# Patient Record
Sex: Female | Born: 1965 | Race: White | Hispanic: No | Marital: Married | Smoking: Never smoker
Health system: Southern US, Community
[De-identification: ages and names within clinical notes are randomized; demographics above are authoritative.]

## PROBLEM LIST (undated history)

## (undated) DIAGNOSIS — F419 Anxiety disorder, unspecified: Secondary | ICD-10-CM

## (undated) DIAGNOSIS — R0789 Other chest pain: Secondary | ICD-10-CM

## (undated) DIAGNOSIS — G479 Sleep disorder, unspecified: Secondary | ICD-10-CM

## (undated) HISTORY — DX: Sleep disorder, unspecified: G47.9

## (undated) HISTORY — PX: ABLATION: SHX5711

## (undated) HISTORY — PX: TUBAL LIGATION: SHX77

---
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
## 1997-05-17 ENCOUNTER — Other Ambulatory Visit: Admission: RE | Admit: 1997-05-17 | Discharge: 1997-05-17 | Payer: Self-pay | Admitting: *Deleted

## 1997-07-26 ENCOUNTER — Ambulatory Visit (HOSPITAL_COMMUNITY): Admission: RE | Admit: 1997-07-26 | Discharge: 1997-07-26 | Payer: Self-pay | Admitting: *Deleted

## 1998-05-30 ENCOUNTER — Other Ambulatory Visit: Admission: RE | Admit: 1998-05-30 | Discharge: 1998-05-30 | Payer: Self-pay | Admitting: *Deleted

## 1999-08-14 ENCOUNTER — Other Ambulatory Visit: Admission: RE | Admit: 1999-08-14 | Discharge: 1999-08-14 | Payer: Self-pay | Admitting: *Deleted

## 1999-10-16 ENCOUNTER — Encounter (INDEPENDENT_AMBULATORY_CARE_PROVIDER_SITE_OTHER): Payer: Self-pay

## 1999-10-16 ENCOUNTER — Other Ambulatory Visit: Admission: RE | Admit: 1999-10-16 | Discharge: 1999-10-16 | Payer: Self-pay | Admitting: *Deleted

## 2000-08-21 ENCOUNTER — Other Ambulatory Visit: Admission: RE | Admit: 2000-08-21 | Discharge: 2000-08-21 | Payer: Self-pay | Admitting: *Deleted

## 2000-12-04 ENCOUNTER — Ambulatory Visit (HOSPITAL_COMMUNITY): Admission: RE | Admit: 2000-12-04 | Discharge: 2000-12-04 | Payer: Self-pay | Admitting: *Deleted

## 2000-12-04 ENCOUNTER — Encounter (INDEPENDENT_AMBULATORY_CARE_PROVIDER_SITE_OTHER): Payer: Self-pay | Admitting: Specialist

## 2001-10-06 ENCOUNTER — Other Ambulatory Visit: Admission: RE | Admit: 2001-10-06 | Discharge: 2001-10-06 | Payer: Self-pay | Admitting: *Deleted

## 2002-10-26 ENCOUNTER — Other Ambulatory Visit: Admission: RE | Admit: 2002-10-26 | Discharge: 2002-10-26 | Payer: Self-pay | Admitting: *Deleted

## 2003-02-24 ENCOUNTER — Ambulatory Visit (HOSPITAL_COMMUNITY): Admission: RE | Admit: 2003-02-24 | Discharge: 2003-02-24 | Payer: Self-pay | Admitting: *Deleted

## 2003-02-24 ENCOUNTER — Encounter (INDEPENDENT_AMBULATORY_CARE_PROVIDER_SITE_OTHER): Payer: Self-pay | Admitting: Specialist

## 2003-11-10 ENCOUNTER — Other Ambulatory Visit: Admission: RE | Admit: 2003-11-10 | Discharge: 2003-11-10 | Payer: Self-pay | Admitting: Obstetrics and Gynecology

## 2004-10-18 ENCOUNTER — Other Ambulatory Visit: Admission: RE | Admit: 2004-10-18 | Discharge: 2004-10-18 | Payer: Self-pay | Admitting: Obstetrics and Gynecology

## 2006-06-22 ENCOUNTER — Ambulatory Visit: Payer: Self-pay

## 2010-06-21 NOTE — H&P (Signed)
NAME:  Judith Baker, Judith Baker                      ACCOUNT NO.:  [id number]   MEDICAL RECORD NO.:  [id number]                   PATIENT TYPE:  AMB   LOCATION:  SDC                                  FACILITY:  [facility]   PHYSICIAN:  Tracie Harrier, M.D.              DATE OF BIRTH:  1965-10-26   DATE OF ADMISSION:  02/23/2003  DATE OF DISCHARGE:                                HISTORY & PHYSICAL   HISTORY OF PRESENT ILLNESS:  Ms. Eke is a 45 year old female gravida 2,  para 2, status post bilateral tubal ligation.  She is admitted for repeat  hysteroscopy and possible endometrial ablation.  She underwent ablation in  2002 which was quite successful.  Since then she has had repeat bleeding  twice weekly for the past 2 months.  Different treatment options were  reviewed with the patient.  Due to her busy schedule she has requested  repeat hysteroscopy and endometrial ablation.  The risk, benefits and goals  of therapy were reviewed with her.  Also the treatment limitations reviewed  as well.   The patient did have a normal Pap smear in September, 2004.  She is  otherwise very healthy.   PAST MEDICAL HISTORY:  1. History of abnormal uterine bleeding.  2. History of anemia.   SURGICAL HISTORY:  1. Hysteroscopy and endometrial ablation in 2002.  2. Bilateral tubal ligation by laparoscopy.   OBSTETRIC HISTORY:  Normal spontaneous vaginal delivery x2 at term.   CURRENT MEDICATION:  Prozac.   ALLERGIES:  None known.   PHYSICAL EXAMINATION:  VITAL SIGNS:  Stable, temperature 97.2, blood  pressure 110/78.  GENERAL:  She is a well-developed, well-nourished female in no acute  distress.  HEENT:  Within normal limits.  NECK:  Supple without adenopathy or thyromegaly.  HEART:  Regular rate and rhythm without murmur, rub, or gallop.  LUNGS:  Clear to auscultation.  BREASTS:  Exam done recently in the office was normal.  However, this is  deferred upon admission.  ABDOMEN:  Soft and benign  without masses, tenderness, hernia, or  organomegaly.  EXTREMITIES:  Neurologically grossly normal.  PELVIC EXAM:  Normal external female genitalia, vagina, cervix clear.  The  uterus is upper limits of normal.  Adnexae clear.   ADMISSION DIAGNOSIS:  Abnormal uterine bleeding.   PLAN:  1. Hysteroscopy.  2. Endometrial ablation with Novasure technology.   The risk and benefits as well as need and alternative therapies reviewed  with patient.  Questions were answered.                                               Tracie Harrier, M.D.    REG/MEDQ  D:  02/23/2003  T:  02/24/2003  Job:  [id number]

## 2010-06-21 NOTE — H&P (Signed)
[facility]  Patient:    Judith Baker, Judith Baker Visit Number: [id number] MRN: [id number]          Service Type: DSU Location: DAY Attending Physician:  Donne Hazel Dictated by:   Willey Blade, M.D. Admit Date:  12/04/2000                           History and Physical  HISTORY OF PRESENT ILLNESS:  The patient is a 45 year old female, gravida 2, para 2, status post bilateral tubal ligation.  The patient is admitted at this time to undergo hysteroscopy with resection of intrauterine process as well as endometrial ablation.  The patient has been plagued with abnormal uterine bleeding over the past causing her anemia.  She underwent ultrasound recently, which showed thickened endometrium at the fundal region consistent with uterine polyps.  After informed consent and discussion of different treatment options, she has decided to proceed with D&C, hysteroscopy, and endometrial ablation.  She is currently taking iron for her anemia, and this has improved somewhat over the past two months.  PAST MEDICAL HISTORY: 1. History of abnormal uterine bleeding. 2. History of anemia.  OBSTETRICAL HISTORY:  Normal spontaneous vaginal delivery x 2 at term.  PAST SURGICAL HISTORY:  Status post laparoscopic bilateral tubal ligation.  MEDICATIONS:  Iron.  ALLERGIES:  None known.  PHYSICAL EXAMINATION:  VITAL SIGNS:  Stable.  Temperature 97.2, pulse 74, respirations 20, blood pressure 110/78.  GENERAL:  She is a well-developed, well-nourished female in no acute distress.  HEENT:  Within normal limits.  NECK:  Supple without adenopathy or thyromegaly.  CHEST:  Lungs clear to auscultation.  BREASTS:  Breast exam done recently in the office was normal but is deferred upon admission.  CARDIAC:  Regular rate and rhythm without murmur, gallop, or rub.  ABDOMEN:  Soft and benign without masses, tenderness, hernia, or organomegaly.  EXTREMITIES:  Grossly  normal.  NEUROLOGIC:  Grossly normal.  PELVIC:  Normal external female genitalia, vagina and cervix clear.  The uterus is small, nontender, and mobile.  The adnexa are clear of mass.  ADMITTING DIAGNOSES: 1. Abnormal uterine bleeding. 2. Intrauterine process, consistent with endometrial polyps. 3. Anemia.  PLAN: 1. Hysteroscopy with resection of endometrial process. 2. Endometrial ablation.  DISCUSSION:  The risks, benefits, and alternative therapies discussed with patient.  She was allowed to ask questions and wished to proceed. Dictated by:   Willey Blade, M.D. Attending Physician:  Donne Hazel DD:  12/04/00 TD:  12/05/00 Job: [id number]

## 2010-06-21 NOTE — Op Note (Signed)
Judith Baker, Judith Baker                      ACCOUNT NO.:  [id number]   MEDICAL RECORD NO.:  [id number]                   PATIENT TYPE:  AMB   LOCATION:  SDC                                  FACILITY:  [facility]   PHYSICIAN:  Tracie Harrier, M.D.              DATE OF BIRTH:  10-22-1965   DATE OF PROCEDURE:  02/27/2003  DATE OF DISCHARGE:  02/24/2003                                 OPERATIVE REPORT   PREOPERATIVE DIAGNOSIS:  Abnormal uterine bleeding.   POSTOPERATIVE DIAGNOSES:  1. Abnormal uterine bleeding.  2. Nevus removal.   PROCEDURE:  1. Hysteroscopy.  2. Ablation of endometriosis with a roller ball.  3. Removal of nevus.   SURGEON:  Tracie Harrier, M.D.   ANESTHESIA:  General.   ESTIMATED BLOOD LOSS:  20 mL.   COMPLICATIONS:  None.   FINDINGS:  At the time of hysteroscopy, a well-treated scarred endometrial  cavity was identified.  There was an area of pink viable tissue in the  fundus to the left.  The right portion of the endometrial cavity was  scarred.  This area of endometrial tissue was ablated.   A large 2 cm left buttock nevus was removed with sharp dissection.   DESCRIPTION OF PROCEDURE:  The patient was taken to the operating room where  a general anesthetic was administered.  The patient was placed on the  operating room table in the dorsal lithotomy position.  The perineum and  vagina were prepped and draped in the usual sterile fashion with Betadine  and sterile drapes.  The bladder was emptied with a red rubber catheter.  A  speculum was placed and the lip of the cervix was grasped with a single-  tooth tenaculum.  The cervix was then dilated until a #23 Pratt dilator  could easily enter the endocervical os.  The hysteroscope was then inserted.  Lactated Ringers was used as a visualizing medium.  The above noted findings  were noted.  The hysteroscope was then removed and the cervix was dilated  until a #31 Pratt dilator fit easily into the  intracervical os.  The viable  tissue in the uterine fundus was then cauterized with the roller ball device  and the resectoscope.  The visualizing medium was switched to sorbitol.  This was ablated although it was minimal tissue.  Good hemostasis was noted.  All of the vaginal instruments were then removed.  Deficit was 150 mL.  Bleeding was minimal.   A nevus on the left buttock was removed.  This was grasped with the pickups  and sharply removed with the scissors.  Bleeding was controlled with  Monsel's solution.  The patient was notified of this after this procedure.  She was comfortable with removal of this.  The patient was then awakened and  taken to the recovery room in good condition.  There were no perioperative  complications.  Tracie Harrier, M.D.    REG/MEDQ  D:  02/27/2003  T:  02/27/2003  Job:  [id number]

## 2010-06-21 NOTE — Op Note (Signed)
[facility]  Patient:    Judith Baker, Judith Baker Visit Number: [id number] MRN: [id number]          Service Type: DSU Location: DAY Attending Physician:  Donne Hazel Dictated by:   Willey Blade, M.D. Proc. Date: 12/04/00 Admit Date:  12/04/2000 Discharge Date: 12/04/2000                             Operative Report  PREOPERATIVE DIAGNOSES: 1. Abnormal uterine bleeding. 2. Endometrial process consistent with endometrial polyps. 3. Anemia.  POSTOPERATIVE DIAGNOSES: 1. Abnormal uterine bleeding. 2. Endometrial process consistent with endometrial polyps. 3. Anemia.  PROCEDURES: 1. Hysteroscopy. 2. Hysteroscopic resection of endometrial polyps. 3. Endometrial ablation electrocautery.  SURGEON:  Willey Blade, M.D.  ANESTHESIA:  General.  ESTIMATED BLOOD LOSS:  20 cc.  COMPLICATIONS:  None.  FINDINGS:  At the time of hysteroscopy, abundant endometrial tissue was encountered.  There were multiple outpouching of this endometrial tissue consistent with polyps.  These were about three in number.  The endometrial ablation went well with no complications.  DESCRIPTION OF PROCEDURE:  The patient was taken to the operating room where a general anesthetic was administered.  The patient was placed on the operating table in the dorsolithotomy position.  The perineum and vagina were prepped and draped in the usual sterile fashion with Betadine and sterile drapes.  A red rubber catheter was used to empty the bladder.  A weighted speculum was placed in the posterior fornix of the vagina and the anterior lip of the cervix was grasped with a single-tooth tenaculum.  The cervix was then serially dilated until a #31 Pratt dilator could easily enter the anterior cervical os.  A hysteroscope was then inserted with the above-noted findings. Sorbitol was used as a visualizing median.  There was no recorded deficit of this fluid during the hysteroscopic  procedure.  The small endometrial polyps at the fundal area were shaved off using the hysteroscopic loop.  Cautery was performed along the say to ensure good hemostasis.  Endometrial ablation was then carried out using the roller bar.  The endometrium was systemically and thoroughly coagulated.  No complications occurred.  As mentioned, no fluid deficit was recorded.  The uterus was ablated as completely as possible with the roller ball device.  After this, one final visualization was carried out with no surgical complications noted.  All vaginal instruments were then removed and the patient awaken.  She was then taken to the recovery room in good condition.  There were no perioperative complications. Dictated by:   Willey Blade, M.D. Attending Physician:  Donne Hazel DD:  12/04/00 TD:  12/06/00 Job: [id number]

## 2011-10-21 ENCOUNTER — Emergency Department (HOSPITAL_COMMUNITY)
Admission: EM | Admit: 2011-10-21 | Discharge: 2011-10-21 | Discharge status: Absent without leave | Payer: 59 | Source: Home / Self Care

## 2013-11-06 ENCOUNTER — Emergency Department
Admission: EM | Admit: 2013-11-06 | Discharge: 2013-11-06 | Disposition: A | Discharge status: Home / Self Care | Payer: 59 | Source: Home / Self Care | Attending: Family Medicine | Admitting: Family Medicine

## 2013-11-06 ENCOUNTER — Encounter: Payer: Self-pay | Admitting: Emergency Medicine

## 2013-11-06 DIAGNOSIS — N3001 Acute cystitis with hematuria: Secondary | ICD-10-CM

## 2013-11-06 LAB — POCT URINALYSIS DIP (MANUAL ENTRY)
BILIRUBIN UA: NEGATIVE
GLUCOSE UA: NEGATIVE
Ketones, POC UA: NEGATIVE
Nitrite, UA: NEGATIVE
PH UA: 5.5
Protein Ur, POC: 30
SPEC GRAV UA: 1.015
UROBILINOGEN UA: 0.2

## 2013-11-06 MED ORDER — CIPROFLOXACIN HCL 500 MG PO TABS: 500.0000 mg | ORAL_TABLET | Freq: Two times a day (BID) | ORAL | Status: DC

## 2013-11-06 NOTE — ED Notes (Signed)
Sx began 10/3 with pressure then frequency and discomfort with urination

## 2013-11-06 NOTE — Discharge Instructions (Signed)
Thank you for coming in today. If your belly pain worsens, or you have high fever, bad vomiting, blood in your stool or black tarry stool go to the Emergency Room.   Urinary Tract Infection Urinary tract infections (UTIs) can develop anywhere along your urinary tract. Your urinary tract is your body's drainage system for removing wastes and extra water. Your urinary tract includes two kidneys, two ureters, a bladder, and a urethra. Your kidneys are a pair of bean-shaped organs. Each kidney is about the size of your fist. They are located below your ribs, one on each side of your spine. CAUSES Infections are caused by microbes, which are microscopic organisms, including fungi, viruses, and bacteria. These organisms are so small that they can only be seen through a microscope. Bacteria are the microbes that most commonly cause UTIs. SYMPTOMS  Symptoms of UTIs may vary by age and gender of the patient and by the location of the infection. Symptoms in young women typically include a frequent and intense urge to urinate and a painful, burning feeling in the bladder or urethra during urination. Older women and men are more likely to be tired, shaky, and weak and have muscle aches and abdominal pain. A fever may mean the infection is in your kidneys. Other symptoms of a kidney infection include pain in your back or sides below the ribs, nausea, and vomiting. DIAGNOSIS To diagnose a UTI, your caregiver will ask you about your symptoms. Your caregiver also will ask to provide a urine sample. The urine sample will be tested for bacteria and white blood cells. White blood cells are made by your body to help fight infection. TREATMENT  Typically, UTIs can be treated with medication. Because most UTIs are caused by a bacterial infection, they usually can be treated with the use of antibiotics. The choice of antibiotic and length of treatment depend on your symptoms and the type of bacteria causing your  infection. HOME CARE INSTRUCTIONS  If you were prescribed antibiotics, take them exactly as your caregiver instructs you. Finish the medication even if you feel better after you have only taken some of the medication.  Drink enough water and fluids to keep your urine clear or pale yellow.  Avoid caffeine, tea, and carbonated beverages. They tend to irritate your bladder.  Empty your bladder often. Avoid holding urine for long periods of time.  Empty your bladder before and after sexual intercourse.  After a bowel movement, women should cleanse from front to back. Use each tissue only once. SEEK MEDICAL CARE IF:   You have back pain.  You develop a fever.  Your symptoms do not begin to resolve within 3 days. SEEK IMMEDIATE MEDICAL CARE IF:   You have severe back pain or lower abdominal pain.  You develop chills.  You have nausea or vomiting.  You have continued burning or discomfort with urination. MAKE SURE YOU:   Understand these instructions.  Will watch your condition.  Will get help right away if you are not doing well or get worse. Document Released: 10/30/2004 Document Revised: 07/22/2011 Document Reviewed: 02/28/2011 ExitCare Patient Information 2015 [employer]. This information is not intended to replace advice given to you by your health care provider. Make sure you discuss any questions you have with your health care provider.  

## 2013-11-06 NOTE — ED Provider Notes (Signed)
Judith Baker is a 48 y.o. female who presents to Urgent Care today for urinary tract infection. Patient has a one-day history of urinary frequency urgency pressure and burning with urination. Symptoms are consistent with previous UTI. Chills nausea vomiting or diarrhea. Patient took leftover ciprofloxacin yesterday which seems to help.   History reviewed. No pertinent past medical history. History  Substance Use Topics  . Smoking status: Not on file  . Smokeless tobacco: Not on file  . Alcohol Use: Not on file   ROS as above Medications: No current facility-administered medications for this encounter.   Current Outpatient Prescriptions  Medication Sig Dispense Refill  . ciprofloxacin (CIPRO) 500 MG tablet Take 1 tablet (500 mg total) by mouth every 12 (twelve) hours.  14 tablet  0    Exam:  BP 132/87  Pulse 86  Temp(Src) 98.3 F (36.8 C) (Oral)  Ht 5\' 3"  (1.6 m)  Wt 129 lb 4 oz (58.627 kg)  BMI 22.90 kg/m2  SpO2 100%  Gen: Well NAD HEENT: EOMI,  MMM Lungs: Normal work of breathing. CTABL Heart: RRR no MRG Abd: NABS, Soft. Nondistended, Nontender no CV angle tenderness to percussion Exts: Brisk capillary refill, warm and well perfused.   Results for orders placed during the hospital encounter of 11/06/13 (from the past 24 hour(s))  POCT URINALYSIS DIP (MANUAL ENTRY)     Status: None   Collection Time    11/06/13  3:15 PM      Result Value Ref Range   Color, UA yellow     Clarity, UA cloudy     Glucose, UA neg     Bilirubin, UA negative     Bilirubin, UA negative     Spec Grav, UA 1.015     Blood, UA moderate     pH, UA 5.5     Protein Ur, POC =30     Urobilinogen, UA 0.2     Nitrite, UA Negative     Leukocytes, UA moderate (2+)     No results found.  Assessment and Plan: 48 y.o. female with urinary tract infection. Plan to treat with Cipro. Culture pending.  Discussed warning signs or symptoms. Please see discharge instructions. Patient expresses  understanding.     Rodolph BongEvan S Corey, MD 11/06/13 [phone]

## 2013-11-08 ENCOUNTER — Telehealth: Payer: Self-pay | Admitting: Emergency Medicine

## 2013-11-08 MED ORDER — SULFAMETHOXAZOLE-TRIMETHOPRIM 800-160 MG PO TABS: 1.0000 | ORAL_TABLET | Freq: Two times a day (BID) | ORAL | Status: DC

## 2013-11-08 NOTE — Telephone Encounter (Signed)
Spoke to pt today. Start cipro on 10/4 does not feel like symptoms are getting better. Culture sensitivities are still pending. She is still having a lot of pelvic pressure and urinating blood today. It comes in waves of a lot of symptoms and then she will feel fine. Denies any fever, chill, n/v/d. Decided to switch to bactrim for 7 days. Stop cipro. Will continue to wait for sensitives. Ok to also take pyridium. Pt has hx of fibroids. Could be that GYN needs to look and see if fibroids causing pressure on bladder.

## 2013-11-09 LAB — URINE CULTURE

## 2013-11-12 ENCOUNTER — Telehealth: Payer: Self-pay | Admitting: Family Medicine

## 2013-11-12 NOTE — ED Notes (Signed)
Urine culture shows E.Coli ESBL sensitive to nitrofurantoin and several IV antibiotics.  Resistant to both Cipro and Bactrim.  Called and left a message advising patient to call me back today.  Rodolph BongEvan S Flora Parks, MD 11/12/13 [phone]

## 2013-11-29 ENCOUNTER — Ambulatory Visit (INDEPENDENT_AMBULATORY_CARE_PROVIDER_SITE_OTHER): Payer: 59 | Admitting: Family Medicine

## 2013-11-29 VITALS — BP 122/82 | HR 72 | Temp 98.3°F | Resp 15 | Ht 62.25 in | Wt 131.4 lb

## 2013-11-29 DIAGNOSIS — L259 Unspecified contact dermatitis, unspecified cause: Secondary | ICD-10-CM

## 2013-11-29 DIAGNOSIS — R21 Rash and other nonspecific skin eruption: Secondary | ICD-10-CM

## 2013-11-29 MED ORDER — EPINEPHRINE 0.3 MG/0.3ML IJ SOAJ: 0.3000 mg | Freq: Once | INTRAMUSCULAR | Status: DC

## 2013-11-29 MED ORDER — METHYLPREDNISOLONE ACETATE 80 MG/ML IJ SUSP
80.0000 mg | Freq: Once | INTRAMUSCULAR | Status: AC
  Administered 2013-11-29: 80 mg via INTRAMUSCULAR

## 2013-11-29 MED ORDER — METHYLPREDNISOLONE (PAK) 4 MG PO TABS: ORAL_TABLET | ORAL | Status: DC

## 2013-11-29 NOTE — Patient Instructions (Signed)
Anaphylactic Reaction An anaphylactic reaction is a sudden, severe allergic reaction that involves the whole body. It can be life threatening. A hospital stay is often required. People with asthma, eczema, or hay fever are slightly more likely to have an anaphylactic reaction. CAUSES  An anaphylactic reaction may be caused by anything to which you are allergic. After being exposed to the allergic substance, your immune system becomes sensitized to it. When you are exposed to that allergic substance again, an allergic reaction can occur. Common causes of an anaphylactic reaction include:  Medicines.  Foods, especially peanuts, wheat, shellfish, milk, and eggs.  Insect bites or stings.  Blood products.  Chemicals, such as dyes, latex, and contrast material used for imaging tests. SYMPTOMS  When an allergic reaction occurs, the body releases histamine and other substances. These substances cause symptoms such as tightening of the airway. Symptoms often develop within seconds or minutes of exposure. Symptoms may include:  Skin rash or hives.  Itching.  Chest tightness.  Swelling of the eyes, tongue, or lips.  Trouble breathing or swallowing.  Lightheadedness or fainting.  Anxiety or confusion.  Stomach pains, vomiting, or diarrhea.  Nasal congestion.  A fast or irregular heartbeat (palpitations). DIAGNOSIS  Diagnosis is based on your history of recent exposure to allergic substances, your symptoms, and a physical exam. Your caregiver may also perform blood or urine tests to confirm the diagnosis. TREATMENT  Epinephrine medicine is the main treatment for an anaphylactic reaction. Other medicines that may be used for treatment include antihistamines, steroids, and albuterol. In severe cases, fluids and medicine to support blood pressure may be given through an intravenous line (IV). Even if you improve after treatment, you need to be observed to make sure your condition does not get  worse. This may require a stay in the hospital. HOME CARE INSTRUCTIONS   Wear a medical alert bracelet or necklace stating your allergy.  You and your family must learn how to use an anaphylaxis kit or give an epinephrine injection to temporarily treat an emergency allergic reaction. Always carry your epinephrine injection or anaphylaxis kit with you. This can be lifesaving if you have a severe reaction.  Do not drive or perform tasks after treatment until the medicines used to treat your reaction have worn off, or until your caregiver says it is okay.  If you have hives or a rash:  Take medicines as directed by your caregiver.  You may use an over-the-counter antihistamine (diphenhydramine) as needed.  Apply cold compresses to the skin or take baths in cool water. Avoid hot baths or showers. SEEK MEDICAL CARE IF:   You develop symptoms of an allergic reaction to a new substance. Symptoms may start right away or minutes later.  You develop a rash, hives, or itching.  You develop new symptoms. SEEK IMMEDIATE MEDICAL CARE IF:   You have swelling of the mouth, difficulty breathing, or wheezing.  You have a tight feeling in your chest or throat.  You develop hives, swelling, or itching all over your body.  You develop severe vomiting or diarrhea.  You feel faint or pass out. This is an emergency. Use your epinephrine injection or anaphylaxis kit as you have been instructed. Call your local emergency services (911 in [place] Even if you improve after the injection, you need to be examined at a hospital emergency department. MAKE SURE YOU:   Understand these instructions.  Will watch your condition.  Will get help right away if you are not   doing well or get worse. Document Released: 01/20/2005 Document Revised: 01/25/2013 Document Reviewed: 04/23/2011 Exit[employer] Patient Information 2015 [employer]. This information is not intended to replace advice given to you by your health  care provider. Make sure you discuss any questions you have with your health care provider.  

## 2013-11-29 NOTE — Progress Notes (Signed)
Chief Complaint:  Chief Complaint  Patient presents with  . Rash    on face ?allergic reaction to make up    HPI: Judith Baker is a 48 y.o. female who is here for  Rash on her face which started today, she can;t think of anything different except that she  Used a new foundation base. She has some swellingbeneath her eyes and also ha sahd a rash on her chest. TThe rash on her chest has resolved, but the rash on her face is still present. She states she feels some heavy pressure when she breathes but that has been intermittent, has not used anythign for this. She ahs ahs allergic reactions in the past where she was given steroids. She has not traveled anywhere, no new meds or foods, she ahs used new sopas but same brand as prior  ie Aveeno. 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 No pertinent past medical history. Past Surgical History  Procedure Laterality Date  . Ablation    . Tubal ligation     History   Social History  . Marital Status: Single    Spouse Name: N/A    Number of Children: N/A  . Years of Education: N/A   Social History Main Topics  . Smoking status: Never Smoker   . Smokeless tobacco: Never Used  . Alcohol Use: 0.5 - 1.0 oz/week    1-2 drink(s) per week  . Drug Use: No  . Sexual Activity: None   Other Topics Concern  . None   Social History Narrative  . None   Family History  Problem Relation Age of Onset  . Diabetes Mother   . Heart disease Father   . Cancer Maternal Grandfather   . Heart disease Paternal Grandmother   . Heart disease Paternal Grandfather    No Known Allergies Prior to Admission medications   Not on File     ROS: The patient denies fevers, chills, night sweats, unintentional weight loss,  palpitations, wheezing, dyspnea on exertion, nausea, vomiting, abdominal pain, dysuria, hematuria, melena, numbness, weakness, or tingling.   All other systems have been reviewed and were otherwise  negative with the exception of those mentioned in the HPI and as above.    PHYSICAL EXAM: Filed Vitals:   11/29/13 1800  BP: 122/82  Pulse: 72  Temp: 98.3 F (36.8 C)  Resp: 15  SPO2 100% Filed Vitals:   11/29/13 1800  Height: 5' 2.25" (1.581 m)  Weight: 131 lb 6 oz (59.591 kg)   Body mass index is 23.84 kg/(m^2).  General: Alert, no acute distress HEENT:  Normocephalic, atraumatic, oropharynx patent. EOMI, PERRLA,  Tm nl, no obstruction, + swelling of kower bleph bialterally, eyrthematous diffuse macular ash on face without any drainage, splotchy in appearanc Cardiovascular:  Regular rate and rhythm, no rubs murmurs or gallops.  No Carotid bruits, radial pulse intact. No pedal edema.  Respiratory: Clear to auscultation bilaterally.  No wheezes, rales, or rhonchi.  No cyanosis, no use of accessory musculature GI: No organomegaly, abdomen is soft and non-tender, positive bowel sounds.  No masses. Skin: No rashes. Neurologic: Facial musculature symmetric. Psychiatric: Patient is appropriate throughout our interaction. Lymphatic: No cervical lymphadenopathy Musculoskeletal: Gait intact.   LABS: Results for orders placed during the hospital encounter of 11/06/13  URINE CULTURE      Result Value Ref Range   Culture ESCHERICHIA COLI     Colony Count 70,000 COLONIES/ML  Organism ID, Bacteria ESCHERICHIA COLI    POCT URINALYSIS DIP (MANUAL ENTRY)      Result Value Ref Range   Color, UA yellow     Clarity, UA cloudy     Glucose, UA neg     Bilirubin, UA negative     Bilirubin, UA negative     Spec Grav, UA 1.015     Blood, UA moderate     pH, UA 5.5     Protein Ur, POC =30     Urobilinogen, UA 0.2     Nitrite, UA Negative     Leukocytes, UA moderate (2+)       EKG/XRAY:   [facility]   ASSESSMENT/PLAN: Encounter Diagnoses  Name Primary?  . Rash and nonspecific skin eruption Yes  . Contact dermatitis    Most likely allergic rxn  to makeup but we are not positive, will moniotr for worsenign sxs Benadryl 50 mg tonight and then 25 mg every 8 hrs until resolved, she is driving so no IM benadryl She received Depmedrol 80 mg x 1 If no improvement in 24-48 hrs then can take medrol dose pack Epi pen given since she states she has had intermittent feelings of breathing problems but again OP patent, voice is normal, she has ahd this all day, and VSS Precautions to go to ER given   Gross sideeffects, risk and benefits, and alternatives of medications d/w patient. Patient is aware that all medications have potential sideeffects and we are unable to predict every sideeffect or drug-drug interaction that may occur.  [name], DO 11/30/2013 8:17 AM

## 2014-05-26 ENCOUNTER — Emergency Department
Admission: EM | Admit: 2014-05-26 | Discharge: 2014-05-26 | Disposition: A | Discharge status: Home / Self Care | Payer: 59 | Source: Home / Self Care | Attending: Emergency Medicine | Admitting: Emergency Medicine

## 2014-05-26 ENCOUNTER — Encounter: Payer: Self-pay | Admitting: *Deleted

## 2014-05-26 DIAGNOSIS — J309 Allergic rhinitis, unspecified: Secondary | ICD-10-CM

## 2014-05-26 DIAGNOSIS — J0101 Acute recurrent maxillary sinusitis: Secondary | ICD-10-CM | POA: Diagnosis not present

## 2014-05-26 LAB — POCT INFLUENZA A/B
Influenza A, POC: NEGATIVE
Influenza B, POC: NEGATIVE

## 2014-05-26 MED ORDER — CEFDINIR 300 MG PO CAPS: 300.0000 mg | ORAL_CAPSULE | Freq: Two times a day (BID) | ORAL | Status: DC

## 2014-05-26 MED ORDER — FLUTICASONE PROPIONATE 50 MCG/ACT NA SUSP: NASAL | Status: DC

## 2014-05-26 MED ORDER — PREDNISONE 10 MG (21) PO TBPK: ORAL_TABLET | ORAL | Status: DC

## 2014-05-26 NOTE — ED Notes (Signed)
Pt c/o sinus pressure, muscle aches, and HA x 6 wks, worse x 1 day. denies fever.

## 2014-05-26 NOTE — ED Provider Notes (Signed)
CSN: [id number]     Arrival date & time 05/26/14  1343 History   First MD Initiated Contact with Patient 05/26/14 1348     Chief Complaint  Patient presents with  . Headache  . Facial Pain   (Consider location/radiation/quality/duration/timing/severity/associated sxs/prior Treatment) HPI SINUSITIS  Onset: 5 weeks of seasonal allergy symptoms that have been waxing and waning. Tried OTC antihistamines such as Zyrtec and Claritin with only minimal help. Then, 1 or 2 days of acute symptoms of severe sinus pressure with discolored rhinorrhea. Some sneezing. Facial/sinus pressure with discolored nasal mucus.    Severity: moderate, progressively worsening. She feels she has sinus allergies and a sinus infection. She has some myalgias and mild headache with the fever and she requests flu test. No exposure to anyone with flu Tried OTC meds without significant relief.  Symptoms:  + Fever  + URI prodrome with nasal congestion + Minimal swollen neck glands + mild Sinus Headache + mild ear pressure  Positive Allergy symptoms No significant Sore Throat No eye symptoms     No significant Cough No chest pain No shortness of breath  No wheezing  No Abdominal Pain No Nausea No Vomiting No diarrhea  No Myalgias No focal neurologic symptoms No syncope No Rash  No Urinary symptoms  Remainder of Review of Systems negative for acute change except as noted in the HPI.  History reviewed. No pertinent past medical history. Past Surgical History  Procedure Laterality Date  . Ablation    . Tubal ligation     Family History  Problem Relation Age of Onset  . Diabetes Mother   . Heart disease Father   . Hypertension Father   . Cancer Maternal Grandfather   . Heart disease Paternal Grandmother   . Heart disease Paternal Grandfather    History  Substance Use Topics  . Smoking status: Never Smoker   . Smokeless tobacco: Never Used  . Alcohol Use: No   OB History    No data  available     Review of Systems  Allergies  Amoxicillin and Norco  Home Medications   Prior to Admission medications   Medication Sig Start Date End Date Taking? Authorizing Provider  cefdinir (OMNICEF) 300 MG capsule Take 1 capsule (300 mg total) by mouth 2 (two) times daily. X 10 days 05/26/14   Lajean Manes, MD  EPINEPHrine (EPIPEN 2-PAK) 0.3 mg/0.3 mL IJ SOAJ injection Inject 0.3 mLs (0.3 mg total) into the muscle once. 11/29/13   Thao P Le, DO  fluticasone (FLONASE) 50 MCG/ACT nasal spray 1 or 2 sprays each nostril twice a day 05/26/14   Lajean Manes, MD  predniSONE (STERAPRED UNI-PAK 21 TAB) 10 MG (21) TBPK tablet Take tapering dosage over 6 days as directed 05/26/14   Lajean Manes, MD   BP 127/85 mmHg  Pulse 96  Temp(Src) 98.7 F (37.1 C) (Oral)  Resp 16  Ht  (1.575 m)  Wt 131 lb (59.421 kg)  BMI 23.95 kg/m2  SpO2 99% Physical Exam  Constitutional: She is oriented to person, place, and time. She appears well-developed and well-nourished. No distress.  HENT:  Head: Normocephalic and atraumatic.  Right Ear: Tympanic membrane, external ear and ear canal normal.  Left Ear: Tympanic membrane, external ear and ear canal normal.  Nose: Mucosal edema and rhinorrhea present. Right sinus exhibits maxillary sinus tenderness. Left sinus exhibits maxillary sinus tenderness.  Mouth/Throat: Oropharynx is clear and moist. No oral lesions. No oropharyngeal exudate.  Eyes: Right eye exhibits  no discharge. Left eye exhibits no discharge. No scleral icterus.  Neck: Neck supple.  Cardiovascular: Normal rate, regular rhythm and normal heart sounds.   Pulmonary/Chest: Effort normal and breath sounds normal. She has no wheezes. She has no rales.  Lymphadenopathy:    She has no cervical adenopathy.  Neurological: She is alert and oriented to person, place, and time.  Skin: Skin is warm and dry.  Nursing note and vitals reviewed.   ED Course  Procedures (including critical care  time) Labs Review Labs Reviewed  POCT INFLUENZA A/B    Imaging Review No results found.   MDM   1. Acute recurrent maxillary sinusitis   2. Allergic sinusitis    Rapid flu test negative Treatment options discussed, as well as risks, benefits, alternatives. Patient voiced understanding and agreement with the following plans:  Discharge Medication List as of 05/26/2014  2:26 PM     Omnicef 300 twice a day 10 days Flonase prescription Prednisone 10 mg-six-day dosepak Follow-up with your primary care doctor in 5-7 days if not improving, or sooner if symptoms become worse. Precautions discussed. Red flags discussed. Questions invited and answered. Patient voiced understanding and agreement.   Lajean Manesavid Massey, MD 05/26/14 1450

## 2014-06-05 ENCOUNTER — Ambulatory Visit (INDEPENDENT_AMBULATORY_CARE_PROVIDER_SITE_OTHER): Payer: 59 | Admitting: Interventional Cardiology

## 2014-06-05 ENCOUNTER — Encounter: Payer: Self-pay | Admitting: Interventional Cardiology

## 2014-06-05 VITALS — BP 110/78 | HR 60 | Ht 63.0 in | Wt 132.1 lb

## 2014-06-05 DIAGNOSIS — R0789 Other chest pain: Secondary | ICD-10-CM

## 2014-06-05 DIAGNOSIS — R079 Chest pain, unspecified: Secondary | ICD-10-CM

## 2014-06-05 HISTORY — DX: Other chest pain: R07.89

## 2014-06-05 NOTE — Patient Instructions (Signed)
Medication Instructions:  Your physician recommends that you continue on your current medications as directed. Please refer to the Current Medication list given to you today.   Labwork: None   Testing/Procedures: Your physician has requested that you have an exercise tolerance test. For further information please visit [email or web address]. Please also follow instruction sheet, as given. (To be scheduled with Dr.Smith)   Follow-Up: Your physician recommends that you schedule a follow-up appointment as needed   Any Other Special Instructions Will Be Listed Below (If Applicable).

## 2014-06-05 NOTE — Progress Notes (Signed)
Cardiology Office Note   Date:  06/05/2014   ID:  [name], DOB [birth year], MRN [id number]  PCP:  Meriel Pica, MD  Cardiologist:   Lesleigh Noe, MD   Chief Complaint  Patient presents with  . 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 Episodes began occurring approximately 3 years ago. On the very first occasion which lasted approximately 30 minutes, EMS was summoned to her office but could not find anything that was abnormal. The episodes resolve completely after 30 minutes and she feels back to normal.There is no associated dyspnea, diaphoresis, lightheadedness, palpitations, or other complaint. She walks on a regular basis. There is no exertional component. She has had increasingly frequent episodes over the past 6 months with 3 episodes occurring since Christmas. Episodes of non-predictable. She denies orthopnea, PND, palpitations.    No past medical history on file.  Past Surgical History  Procedure Laterality Date  . Ablation    . Tubal ligation       Current Outpatient Prescriptions  Medication Sig Dispense Refill  . EPINEPHrine (EPIPEN 2-PAK) 0.3 mg/0.3 mL IJ SOAJ injection Inject 0.3 mLs (0.3 mg total) into the muscle once. 2 Device 0  . fluticasone (FLONASE) 50 MCG/ACT nasal spray Place 2 sprays into both nostrils daily as needed for allergies or rhinitis.     No current facility-administered medications for this visit.    Allergies:   Amoxicillin and Norco    Social History:  The patient  reports that she has never smoked. She has never used smokeless tobacco. She reports that she does not drink alcohol or use illicit drugs.   Family History:  The patient's family history includes Cancer in her maternal grandfather; Diabetes in her mother; Heart disease in her father, paternal grandfather, and  paternal grandmother; Hypertension in her father.    ROS:  Please see the history of present illness.   Otherwise, review of systems are positive for none.   All other systems are reviewed and negative.    PHYSICAL EXAM: VS:  BP 110/78 mmHg  Pulse 60  Ht  (1.6 m)  Wt 132 lb 1.9 oz (59.929 kg)  BMI 23.41 kg/m2 , BMI Body mass index is 23.41 kg/(m^2). GEN: Well nourished, well developed, in no acute distress HEENT: normal Neck: no JVD, carotid bruits, or masses Cardiac: RRR; no murmurs, rubs, or gallops,no edema  Respiratory:  clear to auscultation bilaterally, normal work of breathing GI: soft, nontender, nondistended, + BS MS: no deformity or atrophy Skin: warm and dry, no rash Neuro:  Strength and sensation are intact Psych: euthymic mood, full affect   EKG:  EKG is ordered today. The ekg ordered today demonstrates normal with non-significant inferior Q waves.   Recent Labs: No results found for requested labs within last 365 days.    Lipid Panel No results found for: CHOL, TRIG, HDL, CHOLHDL, VLDL, LDLCALC, LDLDIRECT    Wt Readings from Last 3 Encounters:  06/05/14 132 lb 1.9 oz (59.929 kg)  05/26/14 131 lb (59.421 kg)  11/29/13 131 lb 6 oz (59.591 kg)      Other studies Reviewed: Additional studies/ records that were reviewed today include: No cardiac data is available. Review of the above records demonstrates:    ASSESSMENT AND PLAN:  Chest discomfort: This is a feeling of electrical shock-like  sensation that radiates into the jaw 8 episodes can last up to 30 minutes. She has had a total of 6 over the past 3 years. One was associated with an EMS visit and evaluation. She is concerned because of the significant family history of coronary disease. She exercises on a regular basis without any limitations or precipitation of complaints       Current medicines are reviewed at length with the patient today.  The patient does not have concerns regarding  medicines.  The following changes have been made:  Exercise treadmill test. Consider continuous monitoring to rule out arrhythmia.  Labs/ tests ordered today include:   Orders Placed This Encounter  Procedures  . Exercise Tolerance Test  . EKG 12-Lead     Disposition:   FU with HS in  PRN  Signed, Lesleigh NoeSMITH III,Thoams Siefert W, MD  06/05/2014 5:27 PM    [facility] [address], [place], [place]  [address] Phone: [phone]; Fax: [phone]

## 2014-07-05 ENCOUNTER — Telehealth (HOSPITAL_COMMUNITY): Payer: Self-pay

## 2014-07-05 NOTE — Telephone Encounter (Signed)
Left message on voicemail in reference to upcoming appointment scheduled for 07-06-2014. Phone number given for Baker call back so details instructions can be given. Judith Baker   

## 2014-07-06 ENCOUNTER — Ambulatory Visit (HOSPITAL_COMMUNITY): Payer: 59 | Attending: Interventional Cardiology

## 2014-07-06 ENCOUNTER — Encounter: Payer: 59 | Admitting: Interventional Cardiology

## 2014-07-06 DIAGNOSIS — R079 Chest pain, unspecified: Secondary | ICD-10-CM | POA: Insufficient documentation

## 2014-07-06 LAB — EXERCISE TOLERANCE TEST
CHL CUP MPHR: 171 {beats}/min
CSEPPHR: 155 {beats}/min
Estimated workload: 10.5 METS
Exercise duration (min): 9 min
Exercise duration (sec): 15 s
Percent HR: 90 %
RPE: 18
Rest HR: 63 {beats}/min

## 2014-07-14 ENCOUNTER — Ambulatory Visit: Payer: Self-pay | Admitting: Interventional Cardiology

## 2014-08-02 ENCOUNTER — Ambulatory Visit (INDEPENDENT_AMBULATORY_CARE_PROVIDER_SITE_OTHER): Payer: 59

## 2014-08-02 ENCOUNTER — Ambulatory Visit (INDEPENDENT_AMBULATORY_CARE_PROVIDER_SITE_OTHER): Payer: 59 | Admitting: Urgent Care

## 2014-08-02 VITALS — BP 112/78 | HR 68 | Temp 98.0°F | Resp 18 | Ht 62.5 in | Wt 129.0 lb

## 2014-08-02 DIAGNOSIS — B349 Viral infection, unspecified: Secondary | ICD-10-CM

## 2014-08-02 DIAGNOSIS — R0989 Other specified symptoms and signs involving the circulatory and respiratory systems: Secondary | ICD-10-CM

## 2014-08-02 DIAGNOSIS — R0789 Other chest pain: Secondary | ICD-10-CM | POA: Diagnosis not present

## 2014-08-02 DIAGNOSIS — R0981 Nasal congestion: Secondary | ICD-10-CM

## 2014-08-02 DIAGNOSIS — R05 Cough: Secondary | ICD-10-CM

## 2014-08-02 DIAGNOSIS — R058 Other specified cough: Secondary | ICD-10-CM

## 2014-08-02 LAB — POCT CBC
Granulocyte percent: 64.1 %G (ref 37–80)
HCT, POC: 41.6 % (ref 37.7–47.9)
Hemoglobin: 12.9 g/dL (ref 12.2–16.2)
LYMPH, POC: 2.3 (ref 0.6–3.4)
MCH: 25.7 pg — AB (ref 27–31.2)
MCHC: 31 g/dL — AB (ref 31.8–35.4)
MCV: 82.7 fL (ref 80–97)
MID (CBC): 0.3 (ref 0–0.9)
MPV: 8.3 fL (ref 0–99.8)
PLATELET COUNT, POC: 230 10*3/uL (ref 142–424)
POC Granulocyte: 4.6 (ref 2–6.9)
POC LYMPH PERCENT: 32.1 %L (ref 10–50)
POC MID %: 3.8 %M (ref 0–12)
RBC: 5.02 M/uL (ref 4.04–5.48)
RDW, POC: 15.3 %
WBC: 7.2 10*3/uL (ref 4.6–10.2)

## 2014-08-02 MED ORDER — IPRATROPIUM BROMIDE 0.03 % NA SOLN
2.0000 | Freq: Two times a day (BID) | NASAL | Status: DC
Start: 2014-08-02 — End: 2014-08-25

## 2014-08-02 MED ORDER — GUAIFENESIN ER 1200 MG PO TB12: 1.0000 | ORAL_TABLET | Freq: Two times a day (BID) | ORAL | Status: DC | PRN

## 2014-08-02 MED ORDER — PREDNISONE 10 MG PO TABS: ORAL_TABLET | ORAL | Status: DC

## 2014-08-02 NOTE — Patient Instructions (Signed)

## 2014-08-02 NOTE — Progress Notes (Signed)
MRN: [id number] DOB: 1965/08/04  Subjective:   Judith Baker is a 49 y.o. female presenting for chief complaint of Shortness of Breath; Sore Throat; and Wheezing  Reports ~1 week history of chest congestion, chest tightness, feels like she can't take a deep breath, has had a few coughing fits without phlegm or hemoptysis. Also has had difficult to control allergies for months albeit denies having a history of allergies. Has had left sinus congestion, raspy voice, scratchy throat, intermittent sinus headache. Has tried Flonase intermittently with minimal relief. 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 Of note, in 03-05/2014, patient dealt with tooth infection. Underwent antibiotic treatment with Azithromycin and Augmentin, had an allergic reaction to possibly Norco or Augmentin. Had to take prednisone which resolved her symptoms. Denies fevers, chest pain, ear pain, ear drainage, tooth pain, sore throat, n/v, abdominal pain. Denies smoking, occasional alcohol drink. Denies any other aggravating or relieving factors, no other questions or concerns.  Judith Baker has a current medication list which includes the following prescription(s): alprazolam, fluticasone, and epinephrine. She is allergic to amoxicillin and norco.  Judith Baker  has no past medical history on file. Also  has past surgical history that includes Ablation and Tubal ligation.  ROS As in subjective.  Objective:   Vitals: BP 112/78 mmHg  Pulse 68  Temp(Src) 98 F (36.7 C) (Oral)  Resp 18  Ht 5' 2.5" (1.588 m)  Wt 129 lb (58.514 kg)  BMI 23.20 kg/m2  SpO2 99%  Physical Exam  Constitutional: She is oriented to person, place, and time. She appears well-developed and well-nourished.  HENT:  TM's intact bilaterally, no effusions or erythema. Nasal turbinates slightly erythematous but nasal passage patent with minimal mucus. No sinus tenderness. Postnasal drip present, without oropharyngeal exudates, erythema or  abscesses.  Eyes: Conjunctivae are normal. Right eye exhibits no discharge. Left eye exhibits no discharge. No scleral icterus.  Neck: Normal range of motion. Neck supple.  Cardiovascular: Normal rate, regular rhythm and intact distal pulses.  Exam reveals no gallop and no friction rub.   No murmur heard. Pulmonary/Chest: No stridor. No respiratory distress. She has no wheezes. She has no rales.  Musculoskeletal: She exhibits no edema.  Lymphadenopathy:    She has no cervical adenopathy.  Neurological: She is alert and oriented to person, place, and time.  Skin: Skin is warm and dry. No rash noted. No erythema. No pallor.  Psychiatric: Her mood appears anxious.   [facility] reading (PRIMARY) by  Dr. Neva SeatGreene and PA-Kenyata Napier. Chest: negative for an acute process.  Results for orders placed or performed in visit on 08/02/14 (from the past 24 hour(s))  POCT CBC     Status: Abnormal   Collection Time: 08/02/14  7:15 PM  Result Value Ref Range   WBC 7.2 4.6 - 10.2 K/uL   Lymph, poc 2.3 0.6 - 3.4   POC LYMPH PERCENT 32.1 10 - 50 %L   MID (cbc) 0.3 0 - 0.9   POC MID % 3.8 0 - 12 %M   POC Granulocyte 4.6 2 - 6.9   Granulocyte percent 64.1 37 - 80 %G   RBC 5.02 4.04 - 5.48 M/uL   Hemoglobin 12.9 12.2 - 16.2 g/dL   HCT, POC 69.641.6 29.537.7 - 47.9 %   MCV 82.7 80 - 97 fL   MCH, POC 25.7 (A) 27 - 31.2 pg   MCHC 31.0 (A) 31.8 - 35.4 g/dL   RDW, POC 28.415.3 %   Platelet Count, POC  230 142 - 424 K/uL   MPV 8.3 0 - 99.8 fL   Assessment and Plan :   1. Chest congestion 2. Chest tightness 3. Sinus congestion 4. Non-productive cough 5. Viral syndrome - Discussed possibility of having developed allergies but the patient is not interested in trying oral antihistamine or nasal steroid spray. She is also not wanting to do an albuterol inhaler despite me explaining that it is not a steroid, patient states that she finds it difficult to breathe in with an inhaler. She was wanting a steroid course so I negotiated with  patient to try Sudafed, Mucinex and nasal saline spray but if she's not getting any improvement in 1 week, she can fill the script and/or return to clinic if she develops fevers, chest pain. Patient agreed.  Wallis Bamberg, PA-C [facility] [phone] 08/02/2014 6:46 PM

## 2014-08-24 ENCOUNTER — Other Ambulatory Visit: Payer: Self-pay | Admitting: Urgent Care

## 2014-08-24 NOTE — Telephone Encounter (Signed)
Mike, do you want to give RFs? 

## 2014-08-25 NOTE — Telephone Encounter (Signed)
That's fine  Refill signed

## 2014-08-27 ENCOUNTER — Other Ambulatory Visit: Payer: Self-pay | Admitting: Urgent Care

## 2015-06-04 ENCOUNTER — Ambulatory Visit (INDEPENDENT_AMBULATORY_CARE_PROVIDER_SITE_OTHER): Payer: 59 | Admitting: Internal Medicine

## 2015-06-04 VITALS — BP 136/70 | HR 60 | Temp 98.1°F | Resp 16 | Ht 62.0 in | Wt 129.8 lb

## 2015-06-04 DIAGNOSIS — L259 Unspecified contact dermatitis, unspecified cause: Secondary | ICD-10-CM | POA: Diagnosis not present

## 2015-06-04 MED ORDER — TRIAMCINOLONE ACETONIDE 40 MG/ML IJ SUSP
40.0000 mg | Freq: Once | INTRAMUSCULAR | Status: AC
Start: 2015-06-04 — End: 2015-06-04
  Administered 2015-06-04: 40 mg via INTRAMUSCULAR

## 2015-06-04 MED ORDER — TRIAMCINOLONE ACETONIDE 0.1 % EX CREA: 1.0000 "application " | TOPICAL_CREAM | Freq: Two times a day (BID) | CUTANEOUS | Status: DC

## 2015-06-04 NOTE — Patient Instructions (Signed)
     IF you received an x-ray today, you will receive an invoice from [facility]. Please contact [facility] at [phone] with questions or concerns regarding your invoice.   IF you received labwork today, you will receive an invoice from [facility]. Please contact [facility] at [phone] with questions or concerns regarding your invoice.   Our billing staff will not be able to assist you with questions regarding bills from these companies.  You will be contacted with the lab results as soon as they are available. The fastest way to get your results is to activate your My Chart account. Instructions are located on the last page of this paperwork. If you have not heard from us regarding the results in 2 weeks, please contact this office.         IF you received an x-ray today, you will receive an invoice from [facility]. Please contact [facility] at [phone] with questions or concerns regarding your invoice.   IF you received labwork today, you will receive an invoice from [facility]. Please contact [facility] at [phone] with questions or concerns regarding your invoice.   Our billing staff will not be able to assist you with questions regarding bills from these companies.  You will be contacted with the lab results as soon as they are available. The fastest way to get your results is to activate your My Chart account. Instructions are located on the last page of this paperwork. If you have not heard from us regarding the results in 2 weeks, please contact this office.     

## 2015-06-04 NOTE — Progress Notes (Signed)
   By signing my name below I, Shelah LewandowskyJoseph Thomas, attest that this documentation has been prepared under the direction and in the presence of Ellamae Siaobert Jacori Mulrooney, MD. Electonically Signed. Shelah LewandowskyJoseph Thomas, Scribe 06/04/2015 at 7:14 PM  Subjective:    Patient ID: Judith RidgelPaula Isaacs Baker, female    DOB: 03/10/1965, 50 y.o.   MRN: [id number]  Chief Complaint  Patient presents with  . Rash    on inside left arm    HPI Judith Ridgelaula Isaacs Gallus is a 50 y.o. female who presents to the [facility] complaining of left arm rash. Pt states that rash is pruritic and has been constant for the past 2 days. Started after beach trip.  Pt states she was at the beach 2 week ago and had a similar reaction on both shoulders and arms and thought she was allergic to her sunscreen// states the rash resolved on its own within a day.  On no medications and having no medical problems other than needing Xanax for insomnia related to anxiety.    Review of Systems  Skin: Positive for rash.  [place]     Objective:   Physical Exam  Constitutional: She is oriented to person, place, and time. She appears well-developed and well-nourished. No distress.  HENT:  Head: Normocephalic and atraumatic.  Eyes: Conjunctivae are normal. Pupils are equal, round, and reactive to light.  Neck: Neck supple.  Cardiovascular: Normal rate.   Pulmonary/Chest: Effort normal.  Musculoskeletal: Normal range of motion.  Neurological: She is alert and oriented to person, place, and time.  Skin: Skin is warm and dry.  Large erythematous, vesicular eruption on left antecubital fossa.  Psychiatric: She has a normal mood and affect. Her behavior is normal.  Nursing note and vitals reviewed.   Filed Vitals:   06/04/15 1827  BP: 136/70  Pulse: 60  Temp: 98.1 F (36.7 C)  TempSrc: Oral  Resp: 16  Height: 5\' 2"  (1.575 m)  Weight: 129 lb 12.8 oz (58.877 kg)  SpO2: 98%      Assessment & Plan:  Contact dermatitis and eczema due to  cause - Plan: triamcinolone acetonide (KENALOG-40) injection 40 mg then cream bid 1-2 weeks  Meds ordered this encounter  Medications  . triamcinolone acetonide (KENALOG-40) injection 40 mg    Sig:   . triamcinolone cream (KENALOG) 0.1 %    Sig: Apply 1 application topically 2 (two) times daily.    Dispense:  30 g    Refill:  0    I have completed the patient encounter in its entirety as documented by the scribe, with editing by me where necessary. Lorilynn Lehr P. Merla Richesoolittle, M.D.

## 2015-12-23 IMAGING — CR DG CHEST 2V
2 series · 2 of 2 positions shown · non-contrast
Comparison: None.

CLINICAL DATA: Shortness of Breath

EXAM:
CHEST - 2 VIEW

[PA]
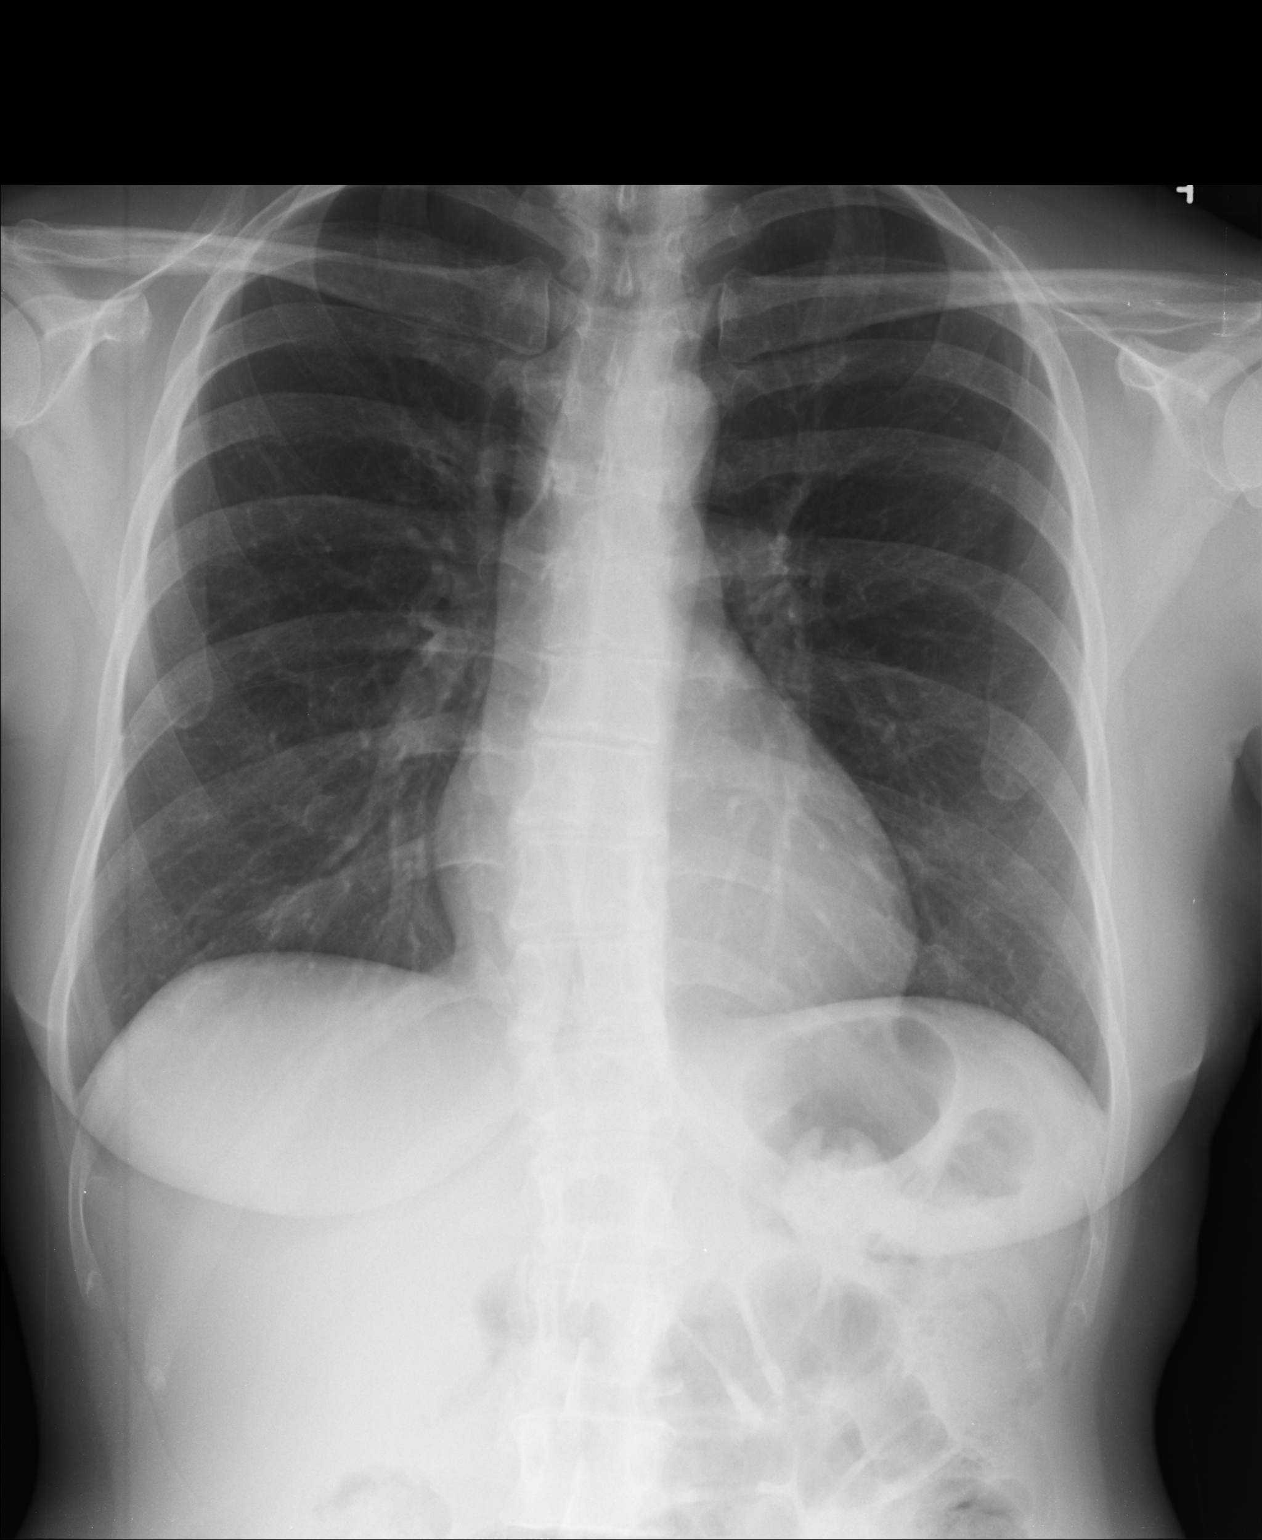

[lateral]
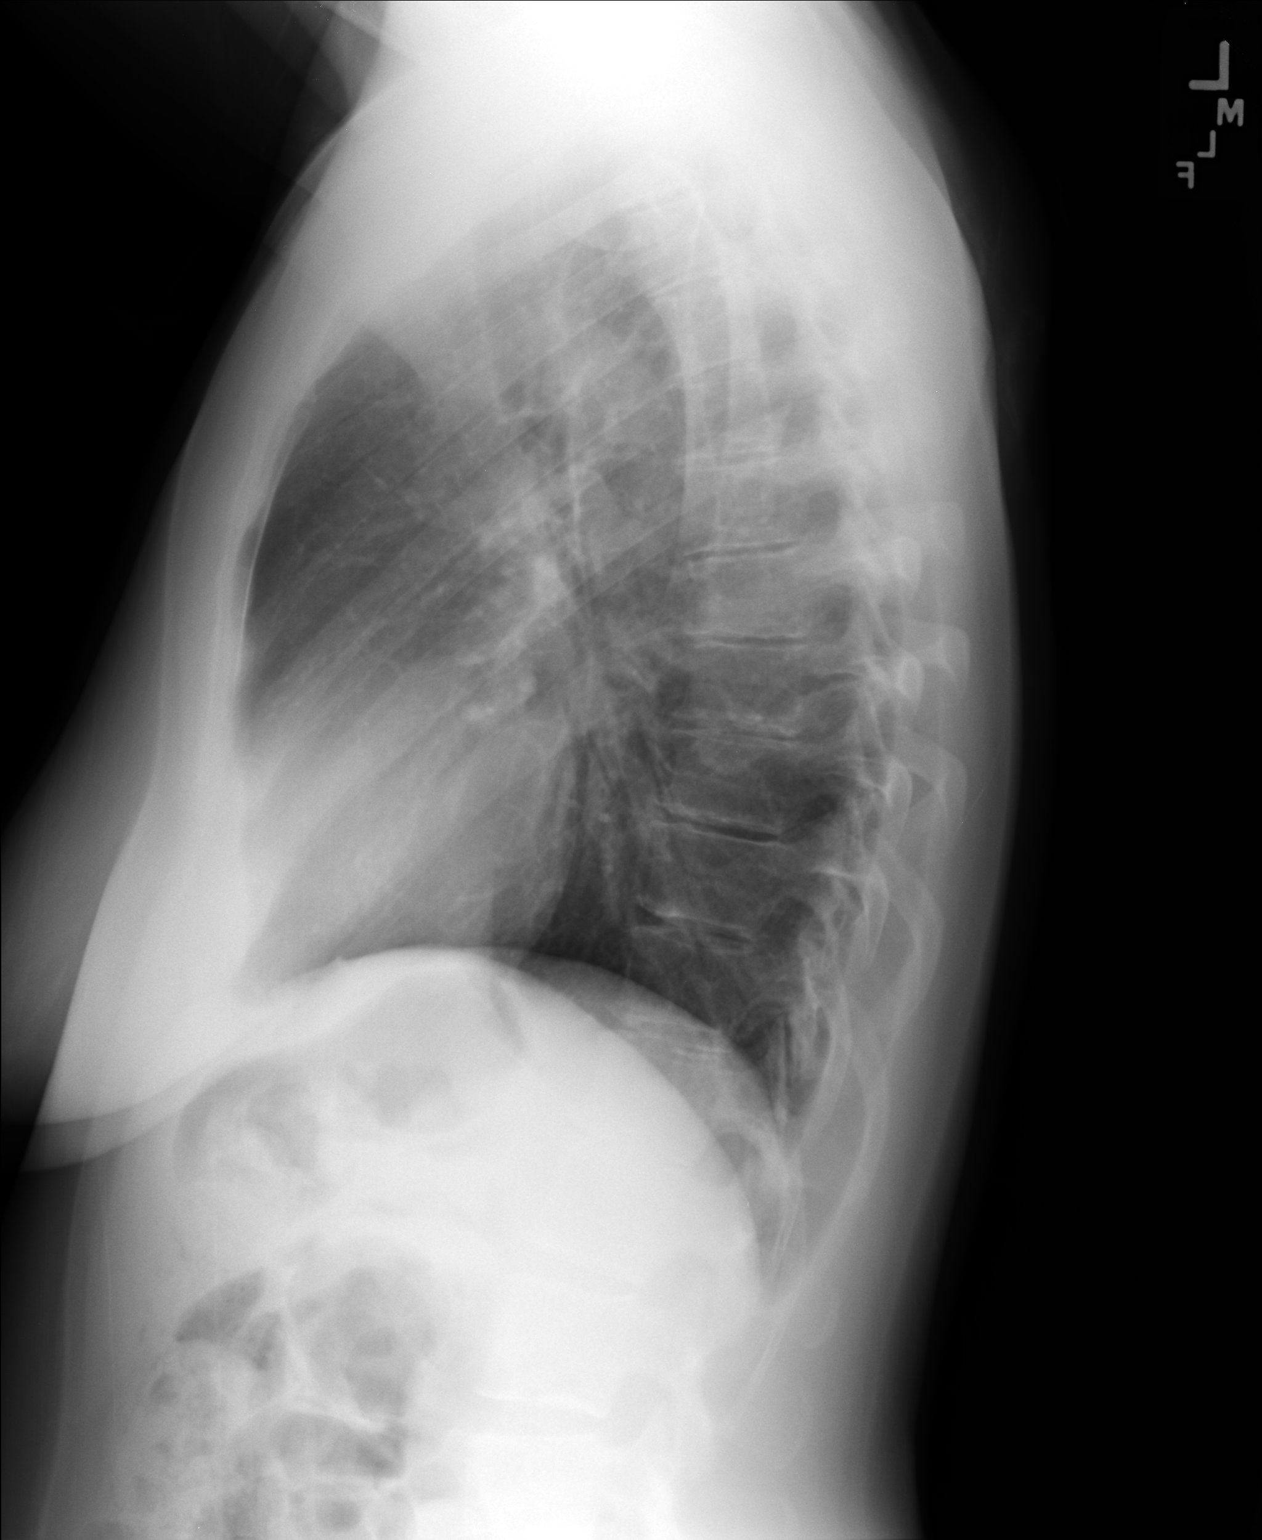

[2 of 2 positions shown; findings below may reference images not displayed]

FINDINGS: The heart size and mediastinal contours are within normal limits.
Both lungs are clear. The visualized skeletal structures are
unremarkable.
IMPRESSION: No active disease.

## 2016-04-12 ENCOUNTER — Ambulatory Visit (INDEPENDENT_AMBULATORY_CARE_PROVIDER_SITE_OTHER): Payer: 59 | Admitting: Physician Assistant

## 2016-04-12 VITALS — BP 128/80 | HR 116 | Temp 97.9°F | Resp 17 | Ht 62.5 in | Wt 128.0 lb

## 2016-04-12 DIAGNOSIS — H00014 Hordeolum externum left upper eyelid: Secondary | ICD-10-CM

## 2016-04-12 MED ORDER — ERYTHROMYCIN 5 MG/GM OP OINT: 1.0000 "application " | TOPICAL_OINTMENT | Freq: Four times a day (QID) | OPHTHALMIC | 0 refills | Status: DC

## 2016-04-12 NOTE — Patient Instructions (Addendum)
Stye A stye is a bump on your eyelid caused by a bacterial infection. A stye can form inside the eyelid (internal stye) or outside the eyelid (external stye). An internal stye may be caused by an infected oil-producing gland inside your eyelid. An external stye may be caused by an infection at the base of your eyelash (hair follicle). Styes are very common. Anyone can get them at any age. They usually occur in just one eye, but you may have more than one in either eye. What are the causes? The infection is almost always caused by bacteria called Staphylococcus aureus. This is a common type of bacteria that lives on your skin. What increases the risk? You may be at higher risk for a stye if you have had one before. You may also be at higher risk if you have:  Diabetes.  Long-term illness.  Long-term eye redness.  A skin condition called seborrhea.  High fat levels in your blood (lipids). What are the signs or symptoms? Eyelid pain is the most common symptom of a stye. Internal styes are more painful than external styes. Other signs and symptoms may include:  Painful swelling of your eyelid.  A scratchy feeling in your eye.  Tearing and redness of your eye.  Pus draining from the stye. How is this diagnosed? Your health care provider may be able to diagnose a stye just by examining your eye. The health care provider may also check to make sure:  You do not have a fever or other signs of a more serious infection.  The infection has not spread to other parts of your eye or areas around your eye. How is this treated? Most styes will clear up in a few days without treatment. In some cases, you may need to use antibiotic drops or ointment to prevent infection. Your health care provider may have to drain the stye surgically if your stye is:  Large.  Causing a lot of pain.  Interfering with your vision. This can be done using a thin blade or a needle. Follow these instructions at  home:  Take medicines only as directed by your health care provider.  Apply a clean, warm compress to your eye for 10 minutes, 4 times a day.  Do not wear contact lenses or eye makeup until your stye has healed.  Do not try to pop or drain the stye. Contact a health care provider if:  You have chills or a fever.  Your stye does not go away after several days.  Your stye affects your vision.  Your eyeball becomes swollen, red, or painful. This information is not intended to replace advice given to you by your health care provider. Make sure you discuss any questions you have with your health care provider. Document Released: 10/30/2004 Document Revised: 09/16/2015 Document Reviewed: 05/06/2013 Elsevier Interactive Patient Education  2017 [employer]     IF you received an x-ray today, you will receive an invoice from [facility]. Please contact [facility] at [phone] with questions or concerns regarding your invoice.   IF you received labwork today, you will receive an invoice from [facility]. Please contact [facility] at [phone] with questions or concerns regarding your invoice.   Our billing staff will not be able to assist you with questions regarding bills from these companies.  You will be contacted with the lab results as soon as they are available. The fastest way to get your results is to activate your My Chart account. Instructions  are located on the last page of this paperwork. If you have not heard from [place] regarding the results in 2 weeks, please contact this office.

## 2016-04-13 NOTE — Progress Notes (Signed)
[facility] [address], [place] [address] [phone]  Date:  04/12/2016   Name:  Judith Baker Cabana   DOB:  24-Apr-1965   MRN:  [id number]  PCP:  Meriel PicaHOLLAND,RICHARD M, MD    History of Present Illness:  Judith Baker Morrell is a 51 y.o. female patient who [facility]. Patient reports that about 3 days ago, her left eyelid started to swell and was painful.  She noticed some drainage to the eye after 2 days and warm compresses.  This has since resolved.  No vision changes, pain with eye movement or eyeball redness.  No fever or UR symptoms.  She has disinfected all her makeup supplies.  She wears contacts but has switched to glasses with this.     Patient Active Problem List   Diagnosis Date Noted  . Chest discomfort 06/05/2014    No past medical history on file.  Past Surgical History:  Procedure Laterality Date  . ABLATION    . TUBAL LIGATION      Social History  Substance Use Topics  . Smoking status: Never Smoker  . Smokeless tobacco: Never Used  . Alcohol use No    Family History  Problem Relation Age of Onset  . Diabetes Mother   . Heart disease Father   . Hypertension Father   . Cancer Maternal Grandfather   . Heart disease Paternal Grandmother   . Heart disease Paternal Grandfather     Allergies  Allergen Reactions  . Amoxicillin Itching, Rash and Other (See Comments)    REACTION: "UNABLE TO MOVE EYES"  . Norco [Hydrocodone-Acetaminophen] Itching, Rash and Other (See Comments)    REACTION: "UNABLE TO MOVE EYES"    Medication list has been reviewed and updated.  Current Outpatient Prescriptions on File Prior to Visit  Medication Sig Dispense Refill  . ALPRAZolam (XANAX) 0.5 MG tablet Take 0.5 mg by mouth at bedtime as needed for anxiety.    . triamcinolone cream (KENALOG) 0.1 % Apply 1 application topically 2 (two) times daily. 30 g 0   No current facility-administered medications on file prior to visit.      ROS ROS otherwise unremarkable unless listed above.   Physical Examination: BP 128/80   Pulse (!) 116   Temp 97.9 F (36.6 C) (Oral)   Resp 17   Ht 5' 2.5" (1.588 m)   Wt 128 lb (58.1 kg)   SpO2 97%   BMI 23.04 kg/m  Ideal Body Weight: Weight in (lb) to have BMI = 25: 138.6  Physical Exam  Constitutional: She is oriented to person, place, and time. She appears well-developed and well-nourished. No distress.  HENT:  Head: Normocephalic and atraumatic.  Right Ear: External ear normal.  Left Ear: External ear normal.  Eyes: Conjunctivae and EOM are normal. Pupils are equal, round, and reactive to light.  Lateral left upper eye lid with mild erythema and edematous.  There is a localized erythematous pustule that has opened.  No purulent drainage at this time.  Eye inverted without foreign body.   Cardiovascular: Normal rate.   Pulmonary/Chest: Effort normal. No respiratory distress.  Neurological: She is alert and oriented to person, place, and time.  Skin: She is not diaphoretic.  Psychiatric: She has a normal mood and affect. Her behavior is normal.     Assessment and Plan: Judith Baker Rape is a 51 y.o. female who is here today for eye irritation. sxs consistent with stye that  is likely healing.  Will do ointment and compresses at this time. rtc as needed. Hordeolum externum of left upper eyelid - Plan: erythromycin (ROMYCIN) ophthalmic ointment  Trena Platt, PA-C [facility] 3/11/20186:46 PM

## 2016-06-20 ENCOUNTER — Telehealth: Payer: Self-pay | Admitting: General Practice

## 2016-06-20 NOTE — Telephone Encounter (Signed)
Pt is calling back to see if she needs to make an appt about getting something for sleep so if she does she can schedule

## 2016-06-20 NOTE — Telephone Encounter (Signed)
New problem, tx up front for an appt

## 2016-06-20 NOTE — Telephone Encounter (Signed)
Pt is needing to talk with someone about getting an rx to get her something to help sleep

## 2016-06-21 ENCOUNTER — Encounter: Payer: Self-pay | Admitting: Family Medicine

## 2016-06-21 ENCOUNTER — Ambulatory Visit (INDEPENDENT_AMBULATORY_CARE_PROVIDER_SITE_OTHER): Payer: 59 | Admitting: Family Medicine

## 2016-06-21 VITALS — BP 145/91 | HR 82 | Temp 99.3°F | Resp 18 | Ht 62.5 in | Wt 129.8 lb

## 2016-06-21 DIAGNOSIS — F5101 Primary insomnia: Secondary | ICD-10-CM

## 2016-06-21 MED ORDER — TRAZODONE HCL 50 MG PO TABS: ORAL_TABLET | ORAL | 2 refills | Status: DC

## 2016-06-21 NOTE — Patient Instructions (Addendum)
Avoid caffeine in the second half daily  Continue getting regular exercise  Try to wind down with quiet activity for the last hour before bedtime. Sleep in a room that is a comfortable or slightly cool temperature, with necessary covers. Avoid having any light in the room.  Take melatonin about 3 mg every evening. After a couple of weeks go up to 6 mg if needed.  Take trazodone 50 mg. Start with one half tablet for for 5 nights, then 1 tablet. If necessary can gradually increase to 75 or 100 mg. If you go up too quickly on the dose it may cause daytime drowsiness, but most people can tolerate a gradual increase.  If too much problem with sleep persists, some other agents can be tried also. There is always the option of sending you to a sleep clinic for insomnia evaluation also if necessary.  Insomnia Insomnia is a sleep disorder that makes it difficult to fall asleep or to stay asleep. Insomnia can cause tiredness (fatigue), low energy, difficulty concentrating, mood swings, and poor performance at work or school. There are three different ways to classify insomnia:  Difficulty falling asleep.  Difficulty staying asleep.  Waking up too early in the morning. Any type of insomnia can be long-term (chronic) or short-term (acute). Both are common. Short-term insomnia usually lasts for three months or less. Chronic insomnia occurs at least three times a week for longer than three months. What are the causes? Insomnia may be caused by another condition, situation, or substance, such as:  Anxiety.  Certain medicines.  Gastroesophageal reflux disease (GERD) or other gastrointestinal conditions.  Asthma or other breathing conditions.  Restless legs syndrome, sleep apnea, or other sleep disorders.  Chronic pain.  Menopause. This may include hot flashes.  Stroke.  Abuse of alcohol, tobacco, or illegal drugs.  Depression.  Caffeine.  Neurological disorders, such as Alzheimer  disease.  An overactive thyroid (hyperthyroidism). The cause of insomnia may not be known. What increases the risk? Risk factors for insomnia include:  Gender. Women are more commonly affected than men.  Age. Insomnia is more common as you get older.  Stress. This may involve your professional or personal life.  Income. Insomnia is more common in people with lower income.  Lack of exercise.  Irregular work schedule or night shifts.  Traveling between different time zones. What are the signs or symptoms? If you have insomnia, trouble falling asleep or trouble staying asleep is the main symptom. This may lead to other symptoms, such as:  Feeling fatigued.  Feeling nervous about going to sleep.  Not feeling rested in the morning.  Having trouble concentrating.  Feeling irritable, anxious, or depressed. How is this treated? Treatment for insomnia depends on the cause. If your insomnia is caused by an underlying condition, treatment will focus on addressing the condition. Treatment may also include:  Medicines to help you sleep.  Counseling or therapy.  Lifestyle adjustments. Follow these instructions at home:  Take medicines only as directed by your health care provider.  Keep regular sleeping and waking hours. Avoid naps.  Keep a sleep diary to help you and your health care provider figure out what could be causing your insomnia. Include:  When you sleep.  When you wake up during the night.  How well you sleep.  How rested you feel the next day.  Any side effects of medicines you are taking.  What you eat and drink.  Make your bedroom a comfortable place where it  is easy to fall asleep:  Put up shades or special blackout curtains to block light from outside.  Use a white noise machine to block noise.  Keep the temperature cool.  Exercise regularly as directed by your health care provider. Avoid exercising right before bedtime.  Use relaxation  techniques to manage stress. Ask your health care provider to suggest some techniques that may work well for you. These may include:  Breathing exercises.  Routines to release muscle tension.  Visualizing peaceful scenes.  Cut back on alcohol, caffeinated beverages, and cigarettes, especially close to bedtime. These can disrupt your sleep.  Do not overeat or eat spicy foods right before bedtime. This can lead to digestive discomfort that can make it hard for you to sleep.  Limit screen use before bedtime. This includes:  Watching TV.  Using your smartphone, tablet, and computer.  Stick to a routine. This can help you fall asleep faster. Try to do a quiet activity, brush your teeth, and go to bed at the same time each night.  Get out of bed if you are still awake after 15 minutes of trying to sleep. Keep the lights down, but try reading or doing a quiet activity. When you feel sleepy, go back to bed.  Make sure that you drive carefully. Avoid driving if you feel very sleepy.  Keep all follow-up appointments as directed by your health care provider. This is important. Contact a health care provider if:  You are tired throughout the day or have trouble in your daily routine due to sleepiness.  You continue to have sleep problems or your sleep problems get worse. Get help right away if:  You have serious thoughts about hurting yourself or someone else. This information is not intended to replace advice given to you by your health care provider. Make sure you discuss any questions you have with your health care provider. Document Released: 01/18/2000 Document Revised: 06/22/2015 Document Reviewed: 10/21/2013 Elsevier Interactive Patient Education  2017 [employer]      IF you received an x-ray today, you will receive an invoice from [facility]. Please contact [facility] at [phone] with questions or concerns regarding your invoice.   IF you received  labwork today, you will receive an invoice from [facility]. Please contact [facility] at [phone] with questions or concerns regarding your invoice.   Our billing staff will not be able to assist you with questions regarding bills from these companies.  You will be contacted with the lab results as soon as they are available. The fastest way to get your results is to activate your My Chart account. Instructions are located on the last page of this paperwork. If you have not heard from us regarding the results in 2 weeks, please contact this office.

## 2016-06-21 NOTE — Progress Notes (Signed)
Patient ID: Judith Baker, female    DOB: 29-May-1965  Age: 51 y.o. MRN: [id number]  Chief Complaint  Patient presents with  . Insomnia    off and on x68months has been using xanax but isnt helping anymore for sleep  and ambien isn't an option     Subjective:  51 year old lady who has problems with insomnia. It has been off and on in her life, more consistently the last few months. She's been taking alprazolam 0.5 mg many nights that are prescribed by her gynecologist. She is a Education officer, community, has her own practice and about 7 employees so she is busy and her mind stays busy with that. She gets regular exercise. Only occasionally has an alcoholic drink. She has not been a smoker. Has some screen time but not a huge amount. She stays busy doing her own housework in the evenings up until bedtime. She lives alone. She has 2 children in graduate school. She is not depressed. She has not tried any melatonin. She has tried OTC diphenhydramine-containing products but they leave her to drag knee. She does get tired in the afternoon. She has chronic back pain problems, and she knows that bothers her sleep some.  Current allergies, medications, problem list, past/family and social histories reviewed.  Objective:  BP (!) 145/91   Pulse 82   Temp 99.3 F (37.4 C) (Oral)   Resp 18   Ht 5' 2.5" (1.588 m)   Wt 129 lb 12.8 oz (58.9 kg)   SpO2 97%   BMI 23.36 kg/m   Pleasant lady alert and oriented. I failed to note blood pressure was a little borderline today. Her neck is supple without nodes or thyromegaly. Chest clear. Heart regular without murmur.  Assessment & Plan:   Assessment: 1. Primary insomnia       Plan: Spent about a half hour talking with her about insomnia. Don't find any major underlying cause. See instructions. Some other sleep medications can be tried if this process doesn't work.  No orders of the defined types were placed in this encounter.   Meds ordered this encounter   Medications  . traZODone (DESYREL) 50 MG tablet    Sig: Take 1/2 to 2 pills at bedtime for sleep as directed.    Dispense:  60 tablet    Refill:  2         Patient Instructions    Avoid caffeine in the second half daily  Continue getting regular exercise  Try to wind down with quiet activity for the last hour before bedtime. Sleep in a room that is a comfortable or slightly cool temperature, with necessary covers. Avoid having any light in the room.  Take melatonin about 3 mg every evening. After a couple of weeks go up to 6 mg if needed.  Take trazodone 50 mg. Start with one half tablet for for 5 nights, then 1 tablet. If necessary can gradually increase to 75 or 100 mg. If you go up too quickly on the dose it may cause daytime drowsiness, but most people can tolerate a gradual increase.  If too much problem with sleep persists, some other agents can be tried also. There is always the option of sending you to a sleep clinic for insomnia evaluation also if necessary.  Insomnia Insomnia is a sleep disorder that makes it difficult to fall asleep or to stay asleep. Insomnia can cause tiredness (fatigue), low energy, difficulty concentrating, mood swings, and poor performance at work or school. There  are three different ways to classify insomnia:  Difficulty falling asleep.  Difficulty staying asleep.  Waking up too early in the morning. Any type of insomnia can be long-term (chronic) or short-term (acute). Both are common. Short-term insomnia usually lasts for three months or less. Chronic insomnia occurs at least three times a week for longer than three months. What are the causes? Insomnia may be caused by another condition, situation, or substance, such as:  Anxiety.  Certain medicines.  Gastroesophageal reflux disease (GERD) or other gastrointestinal conditions.  Asthma or other breathing conditions.  Restless legs syndrome, sleep apnea, or other sleep  disorders.  Chronic pain.  Menopause. This may include hot flashes.  Stroke.  Abuse of alcohol, tobacco, or illegal drugs.  Depression.  Caffeine.  Neurological disorders, such as Alzheimer disease.  An overactive thyroid (hyperthyroidism). The cause of insomnia may not be known. What increases the risk? Risk factors for insomnia include:  Gender. Women are more commonly affected than men.  Age. Insomnia is more common as you get older.  Stress. This may involve your professional or personal life.  Income. Insomnia is more common in people with lower income.  Lack of exercise.  Irregular work schedule or night shifts.  Traveling between different time zones. What are the signs or symptoms? If you have insomnia, trouble falling asleep or trouble staying asleep is the main symptom. This may lead to other symptoms, such as:  Feeling fatigued.  Feeling nervous about going to sleep.  Not feeling rested in the morning.  Having trouble concentrating.  Feeling irritable, anxious, or depressed. How is this treated? Treatment for insomnia depends on the cause. If your insomnia is caused by an underlying condition, treatment will focus on addressing the condition. Treatment may also include:  Medicines to help you sleep.  Counseling or therapy.  Lifestyle adjustments. Follow these instructions at home:  Take medicines only as directed by your health care provider.  Keep regular sleeping and waking hours. Avoid naps.  Keep a sleep diary to help you and your health care provider figure out what could be causing your insomnia. Include:  When you sleep.  When you wake up during the night.  How well you sleep.  How rested you feel the next day.  Any side effects of medicines you are taking.  What you eat and drink.  Make your bedroom a comfortable place where it is easy to fall asleep:  Put up shades or special blackout curtains to block light from  outside.  Use a white noise machine to block noise.  Keep the temperature cool.  Exercise regularly as directed by your health care provider. Avoid exercising right before bedtime.  Use relaxation techniques to manage stress. Ask your health care provider to suggest some techniques that may work well for you. These may include:  Breathing exercises.  Routines to release muscle tension.  Visualizing peaceful scenes.  Cut back on alcohol, caffeinated beverages, and cigarettes, especially close to bedtime. These can disrupt your sleep.  Do not overeat or eat spicy foods right before bedtime. This can lead to digestive discomfort that can make it hard for you to sleep.  Limit screen use before bedtime. This includes:  Watching TV.  Using your smartphone, tablet, and computer.  Stick to a routine. This can help you fall asleep faster. Try to do a quiet activity, brush your teeth, and go to bed at the same time each night.  Get out of bed if you are  still awake after 15 minutes of trying to sleep. Keep the lights down, but try reading or doing a quiet activity. When you feel sleepy, go back to bed.  Make sure that you drive carefully. Avoid driving if you feel very sleepy.  Keep all follow-up appointments as directed by your health care provider. This is important. Contact a health care provider if:  You are tired throughout the day or have trouble in your daily routine due to sleepiness.  You continue to have sleep problems or your sleep problems get worse. Get help right away if:  You have serious thoughts about hurting yourself or someone else. This information is not intended to replace advice given to you by your health care provider. Make sure you discuss any questions you have with your health care provider. Document Released: 01/18/2000 Document Revised: 06/22/2015 Document Reviewed: 10/21/2013 Elsevier Interactive Patient Education  2017 [employer]      IF you  received an x-ray today, you will receive an invoice from [facility]. Please contact [facility] at [phone] with questions or concerns regarding your invoice.   IF you received labwork today, you will receive an invoice from [facility]. Please contact [facility] at [phone] with questions or concerns regarding your invoice.   Our billing staff will not be able to assist you with questions regarding bills from these companies.  You will be contacted with the lab results as soon as they are available. The fastest way to get your results is to activate your My Chart account. Instructions are located on the last page of this paperwork. If you have not heard from us regarding the results in 2 weeks, please contact this office.         Return if symptoms worsen or fail to improve.   HOPPER,DAVID, MD 06/21/2016

## 2016-09-02 ENCOUNTER — Ambulatory Visit (INDEPENDENT_AMBULATORY_CARE_PROVIDER_SITE_OTHER): Payer: 59

## 2016-09-02 ENCOUNTER — Encounter (HOSPITAL_COMMUNITY): Payer: Self-pay | Admitting: Emergency Medicine

## 2016-09-02 ENCOUNTER — Ambulatory Visit (HOSPITAL_COMMUNITY)
Admission: EM | Admit: 2016-09-02 | Discharge: 2016-09-02 | Disposition: A | Discharge status: Home / Self Care | Payer: 59 | Attending: Emergency Medicine | Admitting: Emergency Medicine

## 2016-09-02 DIAGNOSIS — R0981 Nasal congestion: Secondary | ICD-10-CM

## 2016-09-02 NOTE — ED Notes (Addendum)
Patient discharged by provider, pt did verbalize understanding of discharge instructions and denies any further needs or questions at this time. Patient ambulatory with steady gait.

## 2016-09-02 NOTE — ED Triage Notes (Signed)
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 PT attempted to remove it herself. PT attempted to xray it, but could not get a clear film.

## 2016-09-02 NOTE — Discharge Instructions (Signed)
No FB was seen in sinuses or nasal passages.  Reassurance

## 2016-09-02 NOTE — ED Provider Notes (Signed)
CSN: [id number]     Arrival date & time 09/02/16  1759 History   None    Chief Complaint  Patient presents with  . 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     The history is provided by the patient.  Foreign Body in Nose  This is a new problem. The problem occurs constantly. The problem has not changed since onset.   History reviewed. No pertinent past medical history. Past Surgical History:  Procedure Laterality Date  . ABLATION    . TUBAL LIGATION     Family History  Problem Relation Age of Onset  . Diabetes Mother   . Heart disease Father   . Hypertension Father   . Cancer Maternal Grandfather   . Heart disease Paternal Grandmother   . Heart disease Paternal Grandfather    Social History  Substance Use Topics  . Smoking status: Never Smoker  . Smokeless tobacco: Never Used  . Alcohol use Yes     Comment: 1-2 per month   OB History    No data available     Review of Systems  Constitutional: Negative.   HENT: Negative.   Eyes: Negative.   Respiratory: Negative.   Cardiovascular: Negative.   Gastrointestinal: Negative.   Endocrine: Negative.   Genitourinary: Negative.   Musculoskeletal: Negative.   Allergic/Immunologic: Negative.   Neurological: Negative.   Hematological: Negative.   Psychiatric/Behavioral: Negative.     Allergies  Amoxicillin and Norco [hydrocodone-acetaminophen]  Home Medications   Prior to Admission medications   Medication Sig Start Date End Date Taking? Authorizing Provider  ALPRAZolam Prudy Feeler(XANAX) 0.5 MG tablet Take 0.5 mg by mouth at bedtime as needed for anxiety.    [provider]  erythromycin [facility](ROMYCIN) ophthalmic ointment Place 1 application into the left eye 4 (four) times daily. 04/12/16   Trena PlattEnglish,  Stephanie D, PA  traZODone (DESYREL) 50 MG tablet Take 1/2 to 2 pills at bedtime for sleep as directed. 06/21/16   Peyton NajjarHopper, David H, MD  triamcinolone cream (KENALOG) 0.1 % Apply 1 application topically 2 (two) times daily. 06/04/15   Tonye Pearsonoolittle, Robert P, MD   Meds Ordered and Administered this Visit  Medications - No data to display  BP (!) 167/103   Pulse 66   Temp 98.4 F (36.9 C) (Oral)   Resp 16   Ht 5\' 2"  (1.575 m)   Wt 125 lb (56.7 kg)   SpO2 100%   BMI 22.86 kg/m  No data found.   Physical Exam  Constitutional: She appears well-developed and well-nourished.  HENT:  Head: Normocephalic and atraumatic.  Nose: Nose normal.  Eyes: Pupils are equal, round, and reactive to light. Conjunctivae and EOM are normal.  Cardiovascular: Normal rate, regular rhythm and normal heart sounds.   Pulmonary/Chest: Effort normal and breath sounds normal.    Urgent Care Course     Procedures (including critical care time)  Labs Review Labs Reviewed - No data to display  Imaging Review Dg Sinuses Complete  Result Date: 09/02/2016 CLINICAL DATA:  Possible foreign body in right nostril EXAM: PARANASAL SINUSES - COMPLETE 3 + VIEW COMPARISON:  None. FINDINGS: The paranasal sinus are aerated. There is no evidence of sinus opacification air-fluid levels or mucosal thickening. No significant bone abnormalities are seen. No radiopaque foreign body is seen.  IMPRESSION: No radiopaque foreign body is seen. Electronically Signed   By: Charline BillsSriyesh  Krishnan M.D.   On: 09/02/2016 18:41     Visual Acuity Review  Right Eye Distance:   Left Eye Distance:   Bilateral Distance:    Right Eye Near:   Left Eye Near:    Bilateral Near:         MDM   1. Nasal congestion    Sinus xrays normal  Reassurance given no FB seen     Deatra CanterOxford, William J, FNP 09/02/16 1931

## 2016-09-25 ENCOUNTER — Other Ambulatory Visit: Payer: Self-pay | Admitting: Family Medicine

## 2016-09-25 DIAGNOSIS — F5101 Primary insomnia: Secondary | ICD-10-CM

## 2016-10-27 ENCOUNTER — Encounter: Payer: Self-pay | Admitting: Emergency Medicine

## 2016-10-27 ENCOUNTER — Emergency Department
Admission: EM | Admit: 2016-10-27 | Discharge: 2016-10-27 | Disposition: A | Discharge status: Home / Self Care | Payer: 59 | Source: Home / Self Care

## 2016-10-27 DIAGNOSIS — N3000 Acute cystitis without hematuria: Secondary | ICD-10-CM | POA: Diagnosis not present

## 2016-10-27 LAB — POCT URINALYSIS DIP (MANUAL ENTRY)
Bilirubin, UA: NEGATIVE
GLUCOSE UA: NEGATIVE mg/dL
Ketones, POC UA: NEGATIVE mg/dL
NITRITE UA: NEGATIVE
PH UA: 6.5 (ref 5.0–8.0)
PROTEIN UA: NEGATIVE mg/dL
SPEC GRAV UA: 1.015 (ref 1.010–1.025)
UROBILINOGEN UA: 0.2 U/dL — AB

## 2016-10-27 MED ORDER — NITROFURANTOIN MONOHYD MACRO 100 MG PO CAPS: 100.0000 mg | ORAL_CAPSULE | Freq: Two times a day (BID) | ORAL | 0 refills | Status: DC

## 2016-10-27 NOTE — Discharge Instructions (Signed)
Increase fluid intake. °If symptoms become significantly worse during the night or over the weekend, proceed to the local emergency room.  °

## 2016-10-27 NOTE — ED Provider Notes (Signed)
[facility]    CSN: [id number] Arrival date & time: 10/27/16  1843     History   Chief Complaint Chief Complaint  Patient presents with  . Dysuria    HPI Judith Baker is a 51 y.o. female.   Patient awoke today with myalgias, fatigue, and mild suprapubic pressure which she has had in past with cystitis.  No fevers, chills, and sweats.  No nausea/vomiting.  No LMP recorded. Patient is postmenopausal.    The history is provided by the patient.  Dysuria  Pain quality:  Aching Pain severity:  Mild Onset quality:  Sudden Duration:  8 hours Timing:  Constant Progression:  Unchanged Chronicity:  New Recent urinary tract infections: no   Relieved by:  None tried Worsened by:  Nothing Ineffective treatments:  None tried Urinary symptoms: hesitancy   Urinary symptoms: no discolored urine, no foul-smelling urine, no frequent urination, no hematuria and no bladder incontinence   Associated symptoms: no abdominal pain, no fever, no flank pain, no genital lesions, no nausea, no vaginal discharge and no vomiting     No past medical history on file.  Patient Active Problem List   Diagnosis Date Noted  . Chest discomfort 06/05/2014    Past Surgical History:  Procedure Laterality Date  . ABLATION    . TUBAL LIGATION      OB History    No data available       Home Medications    Prior to Admission medications   Medication Sig Start Date End Date Taking? Authorizing Provider  ALPRAZolam Prudy Feeler) 0.5 MG tablet Take 0.5 mg by mouth at bedtime as needed for anxiety.    [provider]  erythromycin [facility]) ophthalmic ointment Place 1 application into the left eye 4 (four) times daily. 04/12/16   Garnetta Buddy, PA  nitrofurantoin, macrocrystal-monohydrate, (MACROBID) 100 MG capsule Take 1 capsule (100 mg total) by mouth 2 (two) times daily. Take with food. 10/27/16   Lattie Haw, MD  traZODone (DESYREL) 50 MG tablet TAKE 1/2 TO 2 PILLS AT  BEDTIME FOR SLEEP AS DIRECTED. 09/29/16   Peyton Najjar, MD  triamcinolone cream (KENALOG) 0.1 % Apply 1 application topically 2 (two) times daily. 06/04/15   Tonye Pearson, MD    Family History Family History  Problem Relation Age of Onset  . Diabetes Mother   . Heart disease Father   . Hypertension Father   . Cancer Maternal Grandfather   . Heart disease Paternal Grandmother   . Heart disease Paternal Grandfather     Social History Social History  Substance Use Topics  . Smoking status: Never Smoker  . Smokeless tobacco: Never Used  . Alcohol use Yes     Comment: 1-2 per month     Allergies   Amoxicillin and Norco [hydrocodone-acetaminophen]   Review of Systems Review of Systems  Constitutional: Negative for fever.  Gastrointestinal: Negative for abdominal pain, nausea and vomiting.  Genitourinary: Positive for dysuria. Negative for flank pain and vaginal discharge.  All other systems reviewed and are negative.    Physical Exam Triage Vital Signs ED Triage Vitals  Enc Vitals Group     BP      Pulse      Resp      Temp      Temp src      SpO2      Weight      Height      Head Circumference  Peak Flow      Pain Score      Pain Loc      Pain Edu?      Excl. in GC?    No data found.   Updated Vital Signs BP 135/88 (BP Location: Right Arm)   Pulse 79   Temp 98.4 F (36.9 C) (Oral)   Wt 130 lb (59 kg)   SpO2 97%   BMI 23.78 kg/m   Visual Acuity Right Eye Distance:   Left Eye Distance:   Bilateral Distance:    Right Eye Near:   Left Eye Near:    Bilateral Near:     Physical Exam Nursing notes and Vital Signs reviewed. Appearance:  Patient appears stated age, and in no acute distress.    Eyes:  Pupils are equal, round, and reactive to light and accomodation.  Extraocular movement is intact.  Conjunctivae are not inflamed   Pharynx:  Normal; moist mucous membranes  Neck:  Supple.  No adenopathy Lungs:  Clear to auscultation.   Breath sounds are equal.  Moving air well. Heart:  Regular rate and rhythm without murmurs, rubs, or gallops.  Abdomen:  Nontender without masses or hepatosplenomegaly.  Bowel sounds are present.  No CVA or flank tenderness.  Extremities:  No edema.  Skin:  No rash present.     UC Treatments / Results  Labs (all labs ordered are listed, but only abnormal results are displayed) Labs Reviewed - No data to display  EKG  EKG Interpretation None       Radiology No results found.  Procedures Procedures (including critical care time)  Medications Ordered in UC Medications - No data to display   Initial Impression / Assessment and Plan / UC Course  I have reviewed the triage vital signs and the nursing notes.  Pertinent labs & imaging results that were available during my care of the patient were reviewed by me and considered in my medical decision making (see chart for details).    Urine culture pending. Reviewed urine culture from 11/06/13:  E. Coli sensitive only to oral med Macrobid.  Begin Macrobid  BID for one week. Increase fluid intake. May use non-prescription AZO for about two days, if desired, to decrease urinary discomfort.  If symptoms become significantly worse during the night or over the weekend, proceed to the local emergency room.  Followup with Family Doctor if not improved in one week.     Final Clinical Impressions(s) / UC Diagnoses   Final diagnoses:  Acute cystitis without hematuria    New Prescriptions New Prescriptions   NITROFURANTOIN, MACROCRYSTAL-MONOHYDRATE, (MACROBID) 100 MG CAPSULE    Take 1 capsule (100 mg total) by mouth 2 (two) times daily. Take with food.        Lattie Haw, MD 10/27/16 [phone]

## 2016-10-27 NOTE — ED Triage Notes (Signed)
Pt c/o urinary pressure and pain that started today.

## 2016-10-28 LAB — URINE CULTURE
MICRO NUMBER:: 81054585
SPECIMEN QUALITY:: ADEQUATE

## 2016-10-29 ENCOUNTER — Telehealth: Payer: Self-pay | Admitting: *Deleted

## 2016-10-29 MED ORDER — CIPROFLOXACIN HCL 500 MG PO TABS: 500.0000 mg | ORAL_TABLET | Freq: Two times a day (BID) | ORAL | 0 refills | Status: DC

## 2016-10-29 NOTE — Telephone Encounter (Signed)
Pt called reports that she is not feeling any better and now has a HA. Per Dr Cathren Harsh change ABT to Cipro. Sent to pharmacy. LM for pt per her request.

## 2016-11-07 ENCOUNTER — Other Ambulatory Visit: Payer: Self-pay | Admitting: Family Medicine

## 2016-11-07 DIAGNOSIS — F5101 Primary insomnia: Secondary | ICD-10-CM

## 2016-12-04 ENCOUNTER — Ambulatory Visit (INDEPENDENT_AMBULATORY_CARE_PROVIDER_SITE_OTHER): Payer: 59 | Admitting: Physician Assistant

## 2016-12-04 VITALS — BP 124/74 | HR 64 | Temp 98.1°F | Resp 16 | Ht 62.0 in | Wt 131.0 lb

## 2016-12-04 DIAGNOSIS — F5101 Primary insomnia: Secondary | ICD-10-CM | POA: Diagnosis not present

## 2016-12-04 DIAGNOSIS — R102 Pelvic and perineal pain: Secondary | ICD-10-CM

## 2016-12-04 LAB — POCT URINALYSIS DIP (MANUAL ENTRY)
Bilirubin, UA: NEGATIVE
Glucose, UA: NEGATIVE mg/dL
Ketones, POC UA: NEGATIVE mg/dL
Nitrite, UA: NEGATIVE
PROTEIN UA: NEGATIVE mg/dL
RBC UA: NEGATIVE
SPEC GRAV UA: 1.01 (ref 1.010–1.025)
UROBILINOGEN UA: 0.2 U/dL
pH, UA: 7.5 (ref 5.0–8.0)

## 2016-12-04 MED ORDER — TRAZODONE HCL 50 MG PO TABS: 50.0000 mg | ORAL_TABLET | Freq: Every day | ORAL | 5 refills | Status: DC

## 2016-12-04 NOTE — Patient Instructions (Addendum)
  Please take a probiotic supplement at this time. Let me know if your symptoms do not improve of the bloating.  Certainly, if it worsens--come in.     IF you received an x-ray today, you will receive an invoice from [facility]. Please contact [facility] at [phone] with questions or concerns regarding your invoice.   IF you received labwork today, you will receive an invoice from [facility]. Please contact [facility] at [phone] with questions or concerns regarding your invoice.   Our billing staff will not be able to assist you with questions regarding bills from these companies.  You will be contacted with the lab results as soon as they are available. The fastest way to get your results is to activate your My Chart account. Instructions are located on the last page of this paperwork. If you have not heard from us regarding the results in 2 weeks, please contact this office.

## 2016-12-04 NOTE — Progress Notes (Deleted)
[facility] [address], [place] [phone]  Date:  12/04/2016   Name:  Judith Baker   DOB:  January 30, 1966   MRN:  [id number]  PCP:  Richarda OverlieHolland, Richard, MD    History of Present Illness:  Judith Baker is a 51 y.o. female patient who presents to PCP with  Chief Complaint  Patient presents with  . Medication Refill    trazodone     --patient is here for trazodone for cc of   Patient Active Problem List   Diagnosis Date Noted  . Chest discomfort 06/05/2014    No past medical history on file.  Past Surgical History:  Procedure Laterality Date  . ABLATION    . TUBAL LIGATION      Social History  Substance Use Topics  . Smoking status: Never Smoker  . Smokeless tobacco: Never Used  . Alcohol use Yes     Comment: 1-2 per month    Family History  Problem Relation Age of Onset  . Diabetes Mother   . Heart disease Father   . Hypertension Father   . Cancer Maternal Grandfather   . Heart disease Paternal Grandmother   . Heart disease Paternal Grandfather     Allergies  Allergen Reactions  . Amoxicillin Itching, Rash and Other (See Comments)    REACTION: "UNABLE TO MOVE EYES"  . Norco [Hydrocodone-Acetaminophen] Itching, Rash and Other (See Comments)    REACTION: "UNABLE TO MOVE EYES"    Medication list has been reviewed and updated.  Current Outpatient Prescriptions on File Prior to Visit  Medication Sig Dispense Refill  . ALPRAZolam (XANAX) 0.5 MG tablet Take 0.5 mg by mouth at bedtime as needed for anxiety.    . ciprofloxacin (CIPRO) 500 MG tablet Take 1 tablet (500 mg total) by mouth 2 (two) times daily. 14 tablet 0  . erythromycin (ROMYCIN) ophthalmic ointment Place 1 application into the left eye 4 (four) times daily. 3.5 g 0  . nitrofurantoin, macrocrystal-monohydrate, (MACROBID) 100 MG capsule Take 1 capsule (100 mg total) by mouth 2 (two) times daily. Take with food. 14 capsule 0  . traZODone (DESYREL) 50 MG tablet TAKE  1/2 TO 2 PILLS AT BEDTIME FOR SLEEP AS DIRECTED. 60 tablet 0  . triamcinolone cream (KENALOG) 0.1 % Apply 1 application topically 2 (two) times daily. 30 g 0   No current facility-administered medications on file prior to visit.     ROS ROS otherwise unremarkable unless listed above.  Physical Examination: BP 124/74   Pulse 64   Temp 98.1 F (36.7 C) (Oral)   Resp 16   Ht 5\' 2"  (1.575 m)   Wt 131 lb (59.4 kg)   SpO2 99%   BMI 23.96 kg/m  Ideal Body Weight: Weight in (lb) to have BMI = 25: 136.4  Physical Exam   Assessment and Plan: Judith Baker is a 51 y.o. female who is here today  There are no diagnoses linked to this encounter.  Trena PlattStephanie Karlie Aung, PA-C [facility] 12/04/2016 2:26 PM

## 2016-12-04 NOTE — Progress Notes (Signed)
PRIMARY CARE AT [facility] [address], [place] [address] [phone]  Date:  12/04/2016   Name:  Judith Baker   DOB:  1965-09-22   MRN:  [id number]  PCP:  Richarda Overlie, MD    History of Present Illness:  Judith Baker is a 51 y.o. female patient who presents to PCP with  Chief Complaint  Patient presents with  . Medication Refill    trazodone     The trazodone is working.  She takes 1 full tablet of the trazodone for sleep.  She is sleeping about 7-8 hours per night.   She reports that she has had recent abdominal issues. She has attempted to alter her food.  She notices gas with protein in the last 4-6 weeks.  No dysuria, hematuria, or frequency. Recent antibiotic use for uti 1 month ago.  Given cipro for this. Patient Active Problem List   Diagnosis Date Noted  . Chest discomfort 06/05/2014    No past medical history on file.  Past Surgical History:  Procedure Laterality Date  . ABLATION    . TUBAL LIGATION      Social History  Substance Use Topics  . Smoking status: Never Smoker  . Smokeless tobacco: Never Used  . Alcohol use Yes     Comment: 1-2 per month    Family History  Problem Relation Age of Onset  . Diabetes Mother   . Heart disease Father   . Hypertension Father   . Cancer Maternal Grandfather   . Heart disease Paternal Grandmother   . Heart disease Paternal Grandfather     Allergies  Allergen Reactions  . Amoxicillin Itching, Rash and Other (See Comments)    REACTION: "UNABLE TO MOVE EYES"  . Norco [Hydrocodone-Acetaminophen] Itching, Rash and Other (See Comments)    REACTION: "UNABLE TO MOVE EYES"    Medication list has been reviewed and updated.  Current Outpatient Prescriptions on File Prior to Visit  Medication Sig Dispense Refill  . ALPRAZolam (XANAX) 0.5 MG tablet Take 0.5 mg by mouth at bedtime as needed for anxiety.    . ciprofloxacin (CIPRO) 500 MG tablet Take 1 tablet (500 mg total) by mouth 2 (two) times  daily. 14 tablet 0  . erythromycin (ROMYCIN) ophthalmic ointment Place 1 application into the left eye 4 (four) times daily. 3.5 g 0  . nitrofurantoin, macrocrystal-monohydrate, (MACROBID) 100 MG capsule Take 1 capsule (100 mg total) by mouth 2 (two) times daily. Take with food. 14 capsule 0  . traZODone (DESYREL) 50 MG tablet TAKE 1/2 TO 2 PILLS AT BEDTIME FOR SLEEP AS DIRECTED. 60 tablet 0  . triamcinolone cream (KENALOG) 0.1 % Apply 1 application topically 2 (two) times daily. 30 g 0   No current facility-administered medications on file prior to visit.     ROS ROS otherwise unremarkable unless listed above.  Physical Examination: BP 124/74   Pulse 64   Temp 98.1 F (36.7 C) (Oral)   Resp 16   Ht 5\' 2"  (1.575 m)   Wt 131 lb (59.4 kg)   SpO2 99%   BMI 23.96 kg/m  Ideal Body Weight: Weight in (lb) to have BMI = 25: 136.4  Physical Exam  Constitutional: She is oriented to person, place, and time. She appears well-developed and well-nourished. No distress.  HENT:  Head: Normocephalic and atraumatic.  Right Ear: External ear normal.  Left Ear: External ear normal.  Eyes: Conjunctivae and EOM are normal. Pupils are equal, round, and reactive  to light.  Cardiovascular: Normal rate.  Pulmonary/Chest: Effort normal. No respiratory distress.  Abdominal: Soft. Normal appearance and bowel sounds are normal. There is generalized tenderness.  Neurological: She is alert and oriented to person, place, and time.  Skin: She is not diaphoretic.  Psychiatric: She has a normal mood and affect. Her behavior is normal.   Results for orders placed or performed in visit on 12/04/16  POCT urinalysis dipstick  Result Value Ref Range   Color, UA yellow yellow   Clarity, UA clear clear   Glucose, UA negative negative mg/dL   Bilirubin, UA negative negative   Ketones, POC UA negative negative mg/dL   Spec Grav, UA 4.0981.010 1.1911.010 - 1.025   Blood, UA negative negative   pH, UA 7.5 5.0 - 8.0    Protein Ur, POC negative negative mg/dL   Urobilinogen, UA 0.2 0.2 or 1.0 E.U./dL   Nitrite, UA Negative Negative   Leukocytes, UA Trace (A) Negative     Assessment and Plan: Judith Baker is a 51 y.o. female who is here today for cc of  Chief Complaint  Patient presents with  . Medication Refill    trazodone  refilling trazodone 6 months.  Advised to use probiotic supplement.  Could be combination of abx with elimination of its general flora may be causing the GI change.   Rtc if no improvement in 1 month. Suprapubic abdominal pain - Plan: POCT urinalysis dipstick  Primary insomnia - Plan: traZODone (DESYREL) 50 MG tablet  Trena PlattStephanie English, PA-C [facility] 11/6/201811:12 AM

## 2016-12-09 ENCOUNTER — Encounter: Payer: Self-pay | Admitting: Physician Assistant

## 2017-05-05 ENCOUNTER — Encounter: Payer: Self-pay | Admitting: Physician Assistant

## 2018-06-21 ENCOUNTER — Emergency Department (HOSPITAL_COMMUNITY)
Admission: EM | Admit: 2018-06-21 | Discharge: 2018-06-21 | Disposition: A | Discharge status: Home / Self Care | Payer: 59 | Attending: Emergency Medicine | Admitting: Emergency Medicine

## 2018-06-21 ENCOUNTER — Other Ambulatory Visit: Payer: Self-pay

## 2018-06-21 ENCOUNTER — Emergency Department (HOSPITAL_COMMUNITY): Payer: 59

## 2018-06-21 ENCOUNTER — Encounter (HOSPITAL_COMMUNITY): Payer: Self-pay | Admitting: Emergency Medicine

## 2018-06-21 DIAGNOSIS — R0789 Other chest pain: Secondary | ICD-10-CM | POA: Diagnosis not present

## 2018-06-21 DIAGNOSIS — Z79899 Other long term (current) drug therapy: Secondary | ICD-10-CM | POA: Diagnosis not present

## 2018-06-21 DIAGNOSIS — R079 Chest pain, unspecified: Secondary | ICD-10-CM | POA: Diagnosis present

## 2018-06-21 LAB — CBC
HCT: 40.5 % (ref 36.0–46.0)
Hemoglobin: 12.9 g/dL (ref 12.0–15.0)
MCH: 27.4 pg (ref 26.0–34.0)
MCHC: 31.9 g/dL (ref 30.0–36.0)
MCV: 86.2 fL (ref 80.0–100.0)
Platelets: 192 10*3/uL (ref 150–400)
RBC: 4.7 MIL/uL (ref 3.87–5.11)
RDW: 13.2 % (ref 11.5–15.5)
WBC: 6 10*3/uL (ref 4.0–10.5)
nRBC: 0 % (ref 0.0–0.2)

## 2018-06-21 LAB — BASIC METABOLIC PANEL
Anion gap: 12 (ref 5–15)
BUN: 18 mg/dL (ref 6–20)
CO2: 25 mmol/L (ref 22–32)
Calcium: 9.6 mg/dL (ref 8.9–10.3)
Chloride: 103 mmol/L (ref 98–111)
Creatinine, Ser: 0.74 mg/dL (ref 0.44–1.00)
GFR calc Af Amer: >60 mL/min (ref >60)
GFR calc non Af Amer: >60 mL/min (ref >60)
Glucose, Bld: 101 mg/dL — ABNORMAL HIGH (ref 70–99)
Potassium: 4 mmol/L (ref 3.5–5.1)
Sodium: 140 mmol/L (ref 135–145)

## 2018-06-21 LAB — TROPONIN I
Troponin I: <0.03 ng/mL (ref <0.03)
Troponin I: <0.03 ng/mL (ref <0.03)

## 2018-06-21 LAB — I-STAT BETA HCG BLOOD, ED (MC, WL, AP ONLY): I-stat hCG, quantitative: <5 m[IU]/mL (ref <5)

## 2018-06-21 MED ORDER — SODIUM CHLORIDE 0.9% FLUSH: 3.0000 mL | Freq: Once | INTRAVENOUS | Status: DC

## 2018-06-21 NOTE — Discharge Instructions (Addendum)
Please return for any problem.  Follow-up with your regular care provider as instructed.  Follow-up with cardiology - Dr. Katrinka Blazing - as instructed.

## 2018-06-21 NOTE — ED Provider Notes (Signed)
[facility] EMERGENCY DEPARTMENT Provider Note   CSN: [id number] Arrival date & time: 06/21/18  1030    History   Chief Complaint Chief Complaint  Patient presents with   Chest Pain    HPI Judith Baker is a 52 y.o. female.     53 year old female with prior medical history as detailed below presents for evaluation of "electricity sensation sweeping through her body".  She reports that she has had this sensation intermittently over the last several years.  She has seen cardiology in 2016 and was told that she had no evidence of significant cardiac disease.  She had a recurrent episode of this "electric sensation".  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  She felt obligated to come in and get checked out.  She describes sensation as being akin to " chewing on aluminum foil."  This sensation lasted approximately 20 minutes approximately 1 hour prior to arrival.  She is now comfortable without any discomfort.  The history is provided by the patient and medical records.  Illness  Location:  "electricity sensation" Severity:  Mild Onset quality:  Sudden Duration:  20 minutes Timing:  Rare Progression:  Resolved Chronicity:  Recurrent Associated symptoms: chest pain   Associated symptoms: no fever and no shortness of breath     History reviewed. No pertinent past medical history.  Patient Active Problem List   Diagnosis Date Noted   Chest discomfort 06/05/2014    Past Surgical History:  Procedure Laterality Date   ABLATION     TUBAL LIGATION       OB History   No obstetric history on file.      Home Medications    Prior to Admission medications   Medication Sig Start Date End Date Taking? Authorizing Provider  ALPRAZolam Prudy Feeler) 0.5 MG tablet Take 0.5 mg by mouth at bedtime as needed for anxiety.   Yes [provider]  ciprofloxacin (CIPRO) 500 MG tablet Take 1 tablet (500 mg total) by mouth 2 (two) times  daily. Patient not taking: Reported on 06/21/2018 10/29/16   Lattie Haw, MD  erythromycin [facility]) ophthalmic ointment Place 1 application into the left eye 4 (four) times daily. Patient not taking: Reported on 06/21/2018 04/12/16   Trena Platt D, PA  nitrofurantoin, macrocrystal-monohydrate, (MACROBID) 100 MG capsule Take 1 capsule (100 mg total) by mouth 2 (two) times daily. Take with food. Patient not taking: Reported on 06/21/2018 10/27/16   Lattie Haw, MD  traZODone (DESYREL) 50 MG tablet Take 1 tablet (50 mg total) by mouth at bedtime. Patient not taking: Reported on 06/21/2018 12/04/16   Trena Platt D, PA  triamcinolone cream (KENALOG) 0.1 % Apply 1 application topically 2 (two) times daily. Patient not taking: Reported on 06/21/2018 06/04/15   Tonye Pearson, MD    Family History Family History  Problem Relation Age of Onset   Diabetes Mother    Heart disease Father    Hypertension Father    Cancer Maternal Grandfather    Heart disease Paternal Grandmother    Heart disease Paternal Grandfather     Social History Social History   Tobacco Use   Smoking status: Never Smoker   Smokeless tobacco: Never Used  Substance Use Topics   Alcohol use: Yes    Comment: 1-2 per month   Drug use: No     Allergies   Amoxicillin and Norco [hydrocodone-acetaminophen]   Review of Systems Review of Systems  Constitutional: Negative  for fever.  Respiratory: Negative for shortness of breath.   Cardiovascular: Positive for chest pain.  All other systems reviewed and are negative.    Physical Exam Updated Vital Signs BP (!) 130/92 (BP Location: Right Arm)    Pulse 67    Temp 98 F (36.7 C) (Oral)    Resp 17    SpO2 100%   Physical Exam Vitals signs and nursing note reviewed.  Constitutional:      General: She is not in acute distress.    Appearance: She is well-developed.  HENT:     Head: Normocephalic and atraumatic.  Eyes:      Conjunctiva/sclera: Conjunctivae normal.     Pupils: Pupils are equal, round, and reactive to light.  Neck:     Musculoskeletal: Normal range of motion and neck supple.  Cardiovascular:     Rate and Rhythm: Normal rate and regular rhythm.     Heart sounds: Normal heart sounds.  Pulmonary:     Effort: Pulmonary effort is normal. No respiratory distress.     Breath sounds: Normal breath sounds. No stridor.  Abdominal:     General: There is no distension.     Palpations: Abdomen is soft.     Tenderness: There is no abdominal tenderness.  Musculoskeletal: Normal range of motion.        General: No deformity.  Skin:    General: Skin is warm and dry.  Neurological:     Mental Status: She is alert and oriented to person, place, and time.      ED Treatments / Results  Labs (all labs ordered are listed, but only abnormal results are displayed) Labs Reviewed  BASIC METABOLIC PANEL - Abnormal; Notable for the following components:      Result Value   Glucose, Bld 101 (*)    All other components within normal limits  CBC  TROPONIN I  TROPONIN I  I-STAT BETA HCG BLOOD, ED ([facility], [facility], [facility] ONLY)    EKG EKG Interpretation  Date/Time:  Monday Jun 21 2018 10:30:59 EDT Ventricular Rate:  67 PR Interval:  146 QRS Duration: 100 QT Interval:  404 QTC Calculation: 426 R Axis:   37 Text Interpretation:  Normal sinus rhythm Cannot rule out Anterior infarct , age undetermined Abnormal ECG Confirmed by Kristine Royal [phone]) on 06/21/2018 11:39:47 AM Also confirmed by Kristine Royal [phone]), editor Elita Quick [phone])  on 06/21/2018 12:45:01 PM   Radiology Dg Chest 2 View  Result Date: 06/21/2018 CLINICAL DATA:  Chest pain EXAM: CHEST - 2 VIEW COMPARISON:  August 02, 2014 FINDINGS: The lungs are clear. The heart size and pulmonary vascularity are normal. No adenopathy. No pneumothorax. No bone lesions. IMPRESSION: No edema or consolidation. Electronically Signed   By: Bretta Bang III  M.D.   On: 06/21/2018 11:12    Procedures Procedures (including critical care time)  Medications Ordered in ED Medications  sodium chloride flush (NS) 0.9 % injection 3 mL (has no administration in time range)     Initial Impression / Assessment and Plan / ED Course  I have reviewed the triage vital signs and the nursing notes.  Pertinent labs & imaging results that were available during my care of the patient were reviewed by me and considered in my medical decision making (see chart for details).        MDM  Screen complete  Rhaelyn Giron was evaluated in Emergency Department on 06/21/2018 for the symptoms described in the history of present  illness. She was evaluated in the context of the global COVID-19 pandemic, which necessitated consideration that the patient might be at risk for infection with the SARS-CoV-2 virus that causes COVID-19. Institutional protocols and algorithms that pertain to the evaluation of patients at risk for COVID-19 are in a state of rapid change based on information released by regulatory bodies including the CDC and federal and state organizations. These policies and algorithms were followed during the patient's care in the ED.  Patient is presenting for evaluation of reported chest discomfort which is atypical in nature.  EKG is without evidence of acute ischemia.  Troponin x2 is negative.  Screening labs obtained in the ED are without evidence of significant abnormality.  Patient's heart score is 1.   She does feel improved following ED evaluation.  She desires discharge home.  She does understand need for close follow-up with both her regular primary care and also cardiology.  Final Clinical Impressions(s) / ED Diagnoses   Final diagnoses:  Atypical chest pain    ED Discharge Orders    None       Wynetta FinesMessick, Kameshia Madruga C, MD 06/21/18 1550

## 2018-06-21 NOTE — ED Triage Notes (Signed)
Pt. Stated, I have this wave of electricity that starts at the bottom and comes up through my chest and into my jaw. I had this episode in 2016 and saw Dr. Katrinka Blazing and found nothing.  I had the same episode this morning at work. At 0930

## 2018-10-04 ENCOUNTER — Telehealth: Payer: Self-pay | Admitting: Interventional Cardiology

## 2018-10-04 NOTE — Telephone Encounter (Signed)
Left message for pt to call back to discuss switching appt to virtual as Dr. Tamala Julian is not in the office tomorrow.

## 2018-10-04 NOTE — Telephone Encounter (Signed)
Pt sent MyChart message and was agreeable to virtual visit.     Virtual Visit Pre-Appointment Phone Call  "(Name), I am calling you today to discuss your upcoming appointment. We are currently trying to limit exposure to the virus that causes COVID-19 by seeing patients at home rather than in the office."  1. "What is the BEST phone number to call the day of the visit?" - include this in appointment notes  2. "Do you have or have access to (through a family member/friend) a smartphone with video capability that we can use for your visit?" a. If yes - list this number in appt notes as "cell" (if different from BEST phone #) and list the appointment type as a VIDEO visit in appointment notes b. If no - list the appointment type as a PHONE visit in appointment notes  3. Confirm consent - "In the setting of the current Covid19 crisis, you are scheduled for a (phone or video) visit with your provider on (date) at (time).  Just as we do with many in-office visits, in order for you to participate in this visit, we must obtain consent.  If you'd like, I can send this to your mychart (if signed up) or email for you to review.  Otherwise, I can obtain your verbal consent now.  All virtual visits are billed to your insurance company just like a normal visit would be.  By agreeing to a virtual visit, we'd like you to understand that the technology does not allow for your provider to perform an examination, and thus may limit your provider's ability to fully assess your condition. If your provider identifies any concerns that need to be evaluated in person, we will make arrangements to do so.  Finally, though the technology is pretty good, we cannot assure that it will always work on either your or our end, and in the setting of a video visit, we may have to convert it to a phone-only visit.  In either situation, we cannot ensure that we have a secure connection.  Are you willing to proceed?" STAFF: Did the  patient verbally acknowledge consent to telehealth visit? Document YES/NO here: YES  4. Advise patient to be prepared - "Two hours prior to your appointment, go ahead and check your blood pressure, pulse, oxygen saturation, and your weight (if you have the equipment to check those) and write them all down. When your visit starts, your provider will ask you for this information. If you have an Apple Watch or Kardia device, please plan to have heart rate information ready on the day of your appointment. Please have a pen and paper handy nearby the day of the visit as well."  5. Give patient instructions for MyChart download to smartphone OR Doximity/Doxy.me as below if video visit (depending on what platform provider is using)  6. Inform patient they will receive a phone call 15 minutes prior to their appointment time (may be from unknown caller ID) so they should be prepared to answer    TELEPHONE CALL NOTE  Judith Baker has been deemed a candidate for a follow-up tele-health visit to limit community exposure during the Covid-19 pandemic. I spoke with the patient via phone to ensure availability of phone/video source, confirm preferred email & phone number, and discuss instructions and expectations.  I reminded Judith Baker to be prepared with any vital sign and/or heart rhythm information that could potentially be obtained via home monitoring, at the time of her visit.  I reminded Judith Baker to expect a phone call prior to her visit.  Julio SicksJennifer L Bowers, RN 10/04/2018 2:49 PM   INSTRUCTIONS FOR DOWNLOADING THE MYCHART APP TO SMARTPHONE  - The patient must first make sure to have activated MyChart and know their login information - If Apple, go to [employer] Store and type in MyChart in the search bar and download the app. If Android, ask patient to go to [employer] Play Store and type in [place] in the search bar and download the app. The app is free but as with any other app downloads,  their phone may require them to verify saved payment information or Apple/Android password.  - The patient will need to then log into the app with their MyChart username and password, and select [place] as their healthcare provider to link the account. When it is time for your visit, go to the MyChart app, find appointments, and click Begin Video Visit. Be sure to Select Allow for your device to access the Microphone and Camera for your visit. You will then be connected, and your provider will be with you shortly.  **If they have any issues connecting, or need assistance please contact MyChart service desk [phone])**  **If using a computer, in order to ensure the best quality for their visit they will need to use either of the following Internet Browsers: [employer] Edge, or Google Chrome**  IF USING DOXIMITY or DOXY.ME - The patient will receive a link just prior to their visit by text.     FULL LENGTH CONSENT FOR TELE-HEALTH VISIT   I hereby voluntarily request, consent and authorize [facility] and its employed or contracted physicians, physician assistants, nurse practitioners or other licensed health care professionals (the Practitioner), to provide me with telemedicine health care services (the "Services") as deemed necessary by the treating Practitioner. I acknowledge and consent to receive the Services by the Practitioner via telemedicine. I understand that the telemedicine visit will involve communicating with the Practitioner through live audiovisual communication technology and the disclosure of certain medical information by electronic transmission. I acknowledge that I have been given the opportunity to request an in-person assessment or other available alternative prior to the telemedicine visit and am voluntarily participating in the telemedicine visit.  I understand that I have the right to withhold or withdraw my consent to the use of telemedicine in the  course of my care at any time, without affecting my right to future care or treatment, and that the Practitioner or I may terminate the telemedicine visit at any time. I understand that I have the right to inspect all information obtained and/or recorded in the course of the telemedicine visit and may receive copies of available information for a reasonable fee.  I understand that some of the potential risks of receiving the Services via telemedicine include:  Marland Kitchen. Delay or interruption in medical evaluation due to technological equipment failure or disruption; . Information transmitted may not be sufficient (e.g. poor resolution of images) to allow for appropriate medical decision making by the Practitioner; and/or  . In rare instances, security protocols could fail, causing a breach of personal health information.  Furthermore, I acknowledge that it is my responsibility to provide information about my medical history, conditions and care that is complete and accurate to the best of my ability. I acknowledge that Practitioner's advice, recommendations, and/or decision may be based on factors not within their control, such as incomplete or inaccurate data provided by me or  distortions of diagnostic images or specimens that may result from electronic transmissions. I understand that the practice of medicine is not an exact science and that Practitioner makes no warranties or guarantees regarding treatment outcomes. I acknowledge that I will receive a copy of this consent concurrently upon execution via email to the email address I last provided but may also request a printed copy by calling the office of [place].    I understand that my insurance will be billed for this visit.   I have read or had this consent read to me. . I understand the contents of this consent, which adequately explains the benefits and risks of the Services being provided via telemedicine.  . I have been provided ample  opportunity to ask questions regarding this consent and the Services and have had my questions answered to my satisfaction. . I give my informed consent for the services to be provided through the use of telemedicine in my medical care  By participating in this telemedicine visit I agree to the above.

## 2018-10-04 NOTE — Progress Notes (Signed)
Virtual Visit via Video Note   This visit type was conducted due to national recommendations for restrictions regarding the COVID-19 Pandemic (e.g. social distancing) in an effort to limit this patient's exposure and mitigate transmission in our community.  Due to her co-morbid illnesses, this patient is at least at moderate risk for complications without adequate follow up.  This format is felt to be most appropriate for this patient at this time.  All issues noted in this document were discussed and addressed.  A limited physical exam was performed with this format.  Please refer to the patient's chart for her consent [facility].   Date:  10/04/2018   ID:  Judith Baker, DOB Apr 07, 1965, MRN [id number]  Patient Location: Home Provider Location: Home  PCP:  Richarda OverlieHolland, Richard, MD  Cardiologist:  No primary care provider on file.  Electrophysiologist:  None   Evaluation Performed:  Follow-Up Visit  Chief Complaint: Chest pain  History of Present Illness:    Judith Baker is a 53 y.o. female with prior h/o electrical shock-like chest  pain and negative workup 2016. Family history of premature CAD.  Judith Baker has a several year history of electrical shock like pain that can start in her legs and move up the left side of her body waxing and waning in severity for up to 30 minutes.  The most recent episode was in May.  Episodes are infrequent.  Because of the strong family history of CAD, she becomes concerned that the symptoms represent cardiac issues.  In May she went to the emergency room where troponin, chest x-ray, and EKG were unremarkable.  She has had no recurrence since that time.  She is not diabetic.  She does not smoke.  She is physically active.  Physical activity does not bring on the episodes of discomfort.  When this last occurred in 2016, an exercise treadmill test was performed and was unremarkable.  The patient does not have symptoms concerning for  COVID-19 infection (fever, chills, cough, or new shortness of breath).    Past Medical History:  Diagnosis Date   Chest discomfort 06/05/2014   Associated jaw discomfort/ radiation    Past Surgical History:  Procedure Laterality Date   ABLATION     TUBAL LIGATION       No outpatient medications have been marked as taking for the 10/05/18 encounter (Appointment) with Lyn RecordsSmith, Tauren Delbuono W, MD.     Allergies:   Amoxicillin and Norco [hydrocodone-acetaminophen]   Social History   Tobacco Use   Smoking status: Never Smoker   Smokeless tobacco: Never Used  Substance Use Topics   Alcohol use: Yes    Comment: 1-2 per month   Drug use: No     Family Hx: The patient's family history includes Cancer in her maternal grandfather; Diabetes in her mother; Heart disease in her father, paternal grandfather, and paternal grandmother; Hypertension in her father.  ROS:   Please see the history of present illness.    She is active.  She is not limited in any way physically.  She wonders if the attacks that she has could be related to anxiety. All other systems reviewed and are negative.   Prior CV studies:   The following studies were reviewed today:  Stress test in 2016 did not reveal evidence of ischemia.  Labs/Other Tests and Data Reviewed:    EKG:  Several electrocardiograms were performed on Jun 21, 2018 all demonstrating poor R wave progression V1 through  V4.  No acute ST-T wave changes noted.   Recent Labs: 06/21/2018: BUN 18; Creatinine, Ser 0.74; Hemoglobin 12.9; Platelets 192; Potassium 4.0; Sodium 140   Recent Lipid Panel No results found for: CHOL, TRIG, HDL, CHOLHDL, LDLCALC, LDLDIRECT  Wt Readings from Last 3 Encounters:  12/04/16 131 lb (59.4 kg)  10/27/16 130 lb (59 kg)  09/02/16 125 lb (56.7 kg)     Objective:    Vital Signs:  There were no vitals taken for this visit.   VITAL SIGNS:  reviewed  She appears healthy.  She is in no distress. No neck vein  distention. Respiratory pattern is normal. No peripheral edema. Normal neurological function  ASSESSMENT & PLAN:    1. Chest discomfort   2. Family history of early CAD   3. Educated About Covid-19 Virus Infection     PLAN:  1. The symptoms and may atypical for myocardial ischemia.  I will take advantage of this opportunity to do additional risk assessment for vascular disease given family history.  She will have a coronary calcium score by CT.  She also needs to have a hemoglobin A1c and lipid panel performed fasting. 2. I take care of her mother who has CAD. 3. Care in the [place] pandemic to avoid infection by masking, and washing, and social distancing.  We discussed primary prevention as outlined:Overall education and awareness concerning primary/secondary risk prevention was discussed in detail: LDL less than 70, hemoglobin A1c less than 7, blood pressure target less than 130/80 mmHg, >150 minutes of moderate aerobic activity per week, avoidance of smoking, weight control (via diet and exercise), and continued surveillance/management of/for obstructive sleep apnea.   COVID-19 Education: The signs and symptoms of COVID-19 were discussed with the patient and how to seek care for testing (follow up with PCP or arrange E-visit).  The importance of social distancing was discussed today.  Time:   Today, I have spent 15 minutes with the patient with telehealth technology discussing the above problems.     Medication Adjustments/Labs and Tests Ordered: Current medicines are reviewed at length with the patient today.  Concerns regarding medicines are outlined above.   Tests Ordered: No orders of the defined types were placed in this encounter.   Medication Changes: No orders of the defined types were placed in this encounter.   Follow Up:  In Person prn  Signed, Sinclair Grooms, MD  10/04/2018 8:43 PM    [place]

## 2018-10-05 ENCOUNTER — Telehealth (INDEPENDENT_AMBULATORY_CARE_PROVIDER_SITE_OTHER): Payer: 59 | Admitting: Interventional Cardiology

## 2018-10-05 ENCOUNTER — Encounter: Payer: Self-pay | Admitting: Interventional Cardiology

## 2018-10-05 ENCOUNTER — Other Ambulatory Visit: Payer: Self-pay

## 2018-10-05 VITALS — BP 138/78 | HR 72

## 2018-10-05 DIAGNOSIS — I712 Thoracic aortic aneurysm, without rupture, unspecified: Secondary | ICD-10-CM

## 2018-10-05 DIAGNOSIS — R072 Precordial pain: Secondary | ICD-10-CM

## 2018-10-05 DIAGNOSIS — Z8249 Family history of ischemic heart disease and other diseases of the circulatory system: Secondary | ICD-10-CM

## 2018-10-05 DIAGNOSIS — R0789 Other chest pain: Secondary | ICD-10-CM

## 2018-10-05 DIAGNOSIS — Z7189 Other specified counseling: Secondary | ICD-10-CM

## 2018-10-05 MED ORDER — METOPROLOL TARTRATE 100 MG PO TABS: ORAL_TABLET | ORAL | 0 refills | Status: DC

## 2018-10-05 NOTE — Addendum Note (Signed)
Addended by: Loren Racer on: 10/05/2018 02:45 PM   Modules accepted: Orders

## 2018-10-05 NOTE — Patient Instructions (Addendum)
Medication Instructions:  Your physician recommends that you continue on your current medications as directed. Please refer to the Current Medication list given to you today.  If you need a refill on your cardiac medications before your next appointment, please call your pharmacy.   Lab work: BMET, Liver, Lipid and A1C when you are fasting (nothing to eat or drink after midnight except water and black coffee).  If you have labs (blood work) drawn today and your tests are completely normal, you will receive your results only by: Marland Kitchen MyChart Message (if you have MyChart) OR . A paper copy in the mail If you have any lab test that is abnormal or we need to change your treatment, we will call you to review the results.  Testing/Procedures: Your physician recommends that you have a Coronary CT performed (please see below for instructions).  Follow-Up: Your physician recommends that you schedule a follow-up appointment as needed with Dr. Tamala Julian.   Any Other Special Instructions Will Be Listed Below (If Applicable).  Your cardiac CT will be scheduled at one of the below locations:   [facility] [address] [place], [place] [address] (336) [place] [address] [place], [place] [address] [phone]  Please arrive at the [facility] main entrance of [facility] 30-45 minutes prior to test start time. Proceed to the [facility] Department (first floor) to check-in and test prep.  Please follow these instructions carefully (unless otherwise directed):   On the Night Before the Test: . Be sure to Drink plenty of water. . Do not consume any caffeinated/decaffeinated beverages or chocolate 12 hours prior to your test. . Do not take any antihistamines 12 hours prior to your test. . If you take Metformin do not take 24 hours prior to test  On the Day of the Test: . Drink plenty of water. Do  not drink any water within one hour of the test. . Do not eat any food 4 hours prior to the test. . You may take your regular medications prior to the test.  . Take metoprolol (Lopressor) two hours prior to test. . HOLD Furosemide/Hydrochlorothiazide morning of the test. . FEMALES- please wear underwire-free bra if available       After the Test: . Drink plenty of water. . After receiving IV contrast, you may experience a mild flushed feeling. This is normal. . On occasion, you may experience a mild rash up to 24 hours after the test. This is not dangerous. If this occurs, you can take Benadryl 25 mg and increase your fluid intake. . If you experience trouble breathing, this can be serious. If it is severe call 911 IMMEDIATELY. If it is mild, please call our office. . If you take any of these medications: Glipizide/Metformin, Avandament, Glucavance, please do not take 48 hours after completing test.    Please contact the cardiac imaging nurse navigator should you have any questions/concerns Marchia Bond, RN Navigator Cardiac [place] [facility] [phone] Office  [phone] Cell

## 2018-10-15 ENCOUNTER — Other Ambulatory Visit: Payer: 59 | Admitting: *Deleted

## 2018-10-15 ENCOUNTER — Other Ambulatory Visit: Payer: Self-pay

## 2018-10-15 DIAGNOSIS — R0789 Other chest pain: Secondary | ICD-10-CM

## 2018-10-15 DIAGNOSIS — I712 Thoracic aortic aneurysm, without rupture, unspecified: Secondary | ICD-10-CM

## 2018-10-15 DIAGNOSIS — R072 Precordial pain: Secondary | ICD-10-CM

## 2018-10-15 DIAGNOSIS — Z8249 Family history of ischemic heart disease and other diseases of the circulatory system: Secondary | ICD-10-CM

## 2018-10-16 LAB — HEPATIC FUNCTION PANEL
ALT: 16 IU/L (ref 0–32)
AST: 22 IU/L (ref 0–40)
Albumin: 4.4 g/dL (ref 3.8–4.9)
Alkaline Phosphatase: 68 IU/L (ref 39–117)
Bilirubin Total: 0.4 mg/dL (ref 0.0–1.2)
Bilirubin, Direct: 0.13 mg/dL (ref 0.00–0.40)
Total Protein: 7 g/dL (ref 6.0–8.5)

## 2018-10-16 LAB — BASIC METABOLIC PANEL
BUN/Creatinine Ratio: 29 — ABNORMAL HIGH (ref 9–23)
BUN: 28 mg/dL — ABNORMAL HIGH (ref 6–24)
CO2: 24 mmol/L (ref 20–29)
Calcium: 9.5 mg/dL (ref 8.7–10.2)
Chloride: 104 mmol/L (ref 96–106)
Creatinine, Ser: 0.96 mg/dL (ref 0.57–1.00)
GFR calc Af Amer: 78 mL/min/{1.73_m2} (ref >59)
GFR calc non Af Amer: 67 mL/min/{1.73_m2} (ref >59)
Glucose: 88 mg/dL (ref 65–99)
Potassium: 4.2 mmol/L (ref 3.5–5.2)
Sodium: 140 mmol/L (ref 134–144)

## 2018-10-16 LAB — HEMOGLOBIN A1C
Est. average glucose Bld gHb Est-mCnc: 108 mg/dL
Hgb A1c MFr Bld: 5.4 % (ref 4.8–5.6)

## 2018-10-16 LAB — LIPID PANEL
Chol/HDL Ratio: 3.3 ratio (ref 0.0–4.4)
Cholesterol, Total: 193 mg/dL (ref 100–199)
HDL: 58 mg/dL (ref >39)
LDL Chol Calc (NIH): 121 mg/dL — ABNORMAL HIGH (ref 0–99)
Triglycerides: 78 mg/dL (ref 0–149)
VLDL Cholesterol Cal: 14 mg/dL (ref 5–40)

## 2018-10-20 ENCOUNTER — Telehealth (HOSPITAL_COMMUNITY): Payer: Self-pay | Admitting: Emergency Medicine

## 2018-10-20 NOTE — Telephone Encounter (Signed)
Reaching out to patient to offer assistance regarding upcoming cardiac imaging study; pt verbalizes understanding of appt date/time, parking situation and where to check in, pre-test NPO status and medications ordered, and verified current allergies; name and call back number provided for further questions should they arise Marchia Bond RN Navigator Cardiac Imaging [place] [facility] [phone] office [phone] cell  Pt reporting resting HR 58-60bpm, I requested she check her HR the AM of test and take only half dose (50mg  metoprolol) if HR > 55bpm; pt verbalized understanding

## 2018-10-22 ENCOUNTER — Ambulatory Visit (HOSPITAL_COMMUNITY)
Admission: RE | Admit: 2018-10-22 | Discharge: 2018-10-22 | Disposition: A | Discharge status: Home / Self Care | Payer: 59 | Source: Ambulatory Visit | Attending: Interventional Cardiology | Admitting: Interventional Cardiology

## 2018-10-22 ENCOUNTER — Other Ambulatory Visit: Payer: Self-pay

## 2018-10-22 DIAGNOSIS — R072 Precordial pain: Secondary | ICD-10-CM

## 2018-10-22 MED ORDER — NITROGLYCERIN 0.4 MG SL SUBL
SUBLINGUAL_TABLET | SUBLINGUAL | Status: AC
  Filled 2018-10-22: qty 2

## 2018-10-22 MED ORDER — NITROGLYCERIN 0.4 MG SL SUBL
0.8000 mg | SUBLINGUAL_TABLET | Freq: Once | SUBLINGUAL | Status: AC
  Administered 2018-10-22: 09:00:00 0.8 mg via SUBLINGUAL
  Filled 2018-10-22: qty 25

## 2018-10-22 MED ORDER — IOHEXOL 350 MG/ML SOLN
80.0000 mL | Freq: Once | INTRAVENOUS | Status: AC | PRN
  Administered 2018-10-22: 09:00:00 80 mL via INTRAVENOUS

## 2018-10-25 ENCOUNTER — Encounter: Payer: Self-pay | Admitting: Family Medicine

## 2018-10-25 ENCOUNTER — Telehealth: Payer: Self-pay

## 2018-10-25 ENCOUNTER — Telehealth (INDEPENDENT_AMBULATORY_CARE_PROVIDER_SITE_OTHER): Payer: 59 | Admitting: Family Medicine

## 2018-10-25 DIAGNOSIS — R519 Headache, unspecified: Secondary | ICD-10-CM

## 2018-10-25 DIAGNOSIS — Z8249 Family history of ischemic heart disease and other diseases of the circulatory system: Secondary | ICD-10-CM

## 2018-10-25 DIAGNOSIS — R0789 Other chest pain: Secondary | ICD-10-CM

## 2018-10-25 DIAGNOSIS — R51 Headache: Secondary | ICD-10-CM

## 2018-10-25 DIAGNOSIS — R072 Precordial pain: Secondary | ICD-10-CM

## 2018-10-25 NOTE — Telephone Encounter (Signed)
LMTCB

## 2018-10-25 NOTE — Progress Notes (Signed)
CC- sever headache for 5 days. But thinking it may be sinus infection. Sinus area is puffy and sinus pressure,Tender to the tough, and a cloudy head feeling. No fever, no drainage.  This has been going on for 5 days since last friday. Its a little better but its still there.

## 2018-10-25 NOTE — Progress Notes (Signed)
   Virtual Visit Note  I connected with patient on 10/25/18 at 548pm by phone and verified that I am speaking with the correct person using two identifiers. Judith Baker is currently located at home and patient is currently with them during visit. The provider, Rutherford Guys, MD is located in their office at time of visit.  I discussed the limitations, risks, security and privacy concerns of performing an evaluation and management service by telephone and the availability of in person appointments. I also discussed with the patient that there may be a patient responsible charge related to this service. The patient expressed understanding and agreed to proceed.   CC: sinus pressure and headache  HPI ? Patient having frontal headache with sinus pressure for past 5 days Tylenol/ibuprofen not helping, headaches were persistent Until 2-3 days ago Today much better No fever, no drainage Having some allergies, denies any PND, ear pressure, cough No sign headache history Exercises regularly  BP Readings from Last 3 Encounters:  10/22/18 121/77  10/05/18 138/78  06/21/18 (!) 155/91       Allergies  Allergen Reactions  . Amoxicillin Itching, Rash and Other (See Comments)    REACTION: "UNABLE TO MOVE EYES"  . Norco [Hydrocodone-Acetaminophen] Itching, Rash and Other (See Comments)    REACTION: "UNABLE TO MOVE EYES"    Prior to Admission medications   Medication Sig Start Date End Date Taking? Authorizing Provider  ALPRAZolam Duanne Moron) 0.5 MG tablet Take 0.5 mg by mouth at bedtime as needed for anxiety.   Yes [provider]    Past Medical History:  Diagnosis Date  . Chest discomfort 06/05/2014   Associated jaw discomfort/ radiation     Past Surgical History:  Procedure Laterality Date  . ABLATION    . TUBAL LIGATION      Social History   Tobacco Use  . Smoking status: Never Smoker  . Smokeless tobacco: Never Used  Substance Use Topics  . Alcohol use:  Yes    Comment: 1-2 per month    Family History  Problem Relation Age of Onset  . Diabetes Mother   . Heart disease Father   . Hypertension Father   . Cancer Maternal Grandfather   . Heart disease Paternal Grandmother   . Heart disease Paternal Grandfather     ROS Per hpi  Objective  Vitals as reported by the patient: none   ASSESSMENT and PLAN  1. Sinus headache Improving. Discussed supportive measures, nasal saline, oral decongestant, flonase. RTC precautions reviewed  FOLLOW-UP: prn   The above assessment and management plan was discussed with the patient. The patient verbalized understanding of and has agreed to the management plan. Patient is aware to call the clinic if symptoms persist or worsen. Patient is aware when to return to the clinic for a follow-up visit. Patient educated on when it is appropriate to go to the emergency department.    I provided 12 minutes of non-face-to-face time during this encounter.  Rutherford Guys, MD Primary Care at [place], [place] [address] Ph.  [phone] Fax [phone]

## 2018-10-25 NOTE — Telephone Encounter (Signed)
Spoke with the pt and she is anxious about her LDL... she is asking if we can place an order for a repeat LDL in about a month... she did not eat well the night prior to her last labs which is unusual for her and she would like to be sure prior to starting the Crestor.   Pt also asking for a Neurology consult based on her continued "electrical discomfort" that she has in her chest occasionally.. she is no longer seeing her PCP Dr. Matthew Saras and is asking if Dr. Tamala Julian could put in the referral.   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

## 2018-10-25 NOTE — Telephone Encounter (Signed)
-----   Message from Belva Crome, MD sent at 10/23/2018  7:18 PM EDT ----- Let the patient know the heart and arteries are normal. No evidence of plaque or blockage. Very reassuring. Unlikely spells of pain are heart related. A copy will be sent to Molli Posey, MD

## 2018-10-27 NOTE — Telephone Encounter (Signed)
My chart message sent back to the pt and labs/ referral ordered.

## 2018-10-27 NOTE — Telephone Encounter (Signed)
I am not concerned about the cholesterol. Will probably be the same range. Okay to do if she prefers.  Okay to place Neurology consult for paresthesias in chest

## 2018-12-09 ENCOUNTER — Other Ambulatory Visit: Payer: Self-pay

## 2018-12-09 ENCOUNTER — Ambulatory Visit: Payer: 59 | Admitting: Neurology

## 2018-12-09 ENCOUNTER — Encounter: Payer: Self-pay | Admitting: Neurology

## 2018-12-09 VITALS — BP 122/72 | HR 80 | Temp 97.8°F | Ht 62.0 in | Wt 134.5 lb

## 2018-12-09 DIAGNOSIS — F5104 Psychophysiologic insomnia: Secondary | ICD-10-CM

## 2018-12-09 DIAGNOSIS — R202 Paresthesia of skin: Secondary | ICD-10-CM | POA: Insufficient documentation

## 2018-12-09 MED ORDER — TRAZODONE HCL 50 MG PO TABS: 50.0000 mg | ORAL_TABLET | Freq: Every day | ORAL | 11 refills | Status: DC

## 2018-12-09 NOTE — Progress Notes (Signed)
PATIENT: Judith Baker DOB: 1965-07-21  Chief Complaint  Patient presents with  . Numbness    She is having cyclical episodes of electrical shocking sensations that start in her feet, move into her chest and then into her face.  Reports unrmarkable cardiac evaluations.  Her first espisode occurred in 2013.  Most recent event in May 2020.   Marland Kitchen. PCP    Richarda OverlieHolland, Richard, MD  . 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 Lyn RecordsSmith, Henry W, and primary care physician Dr. Richarda OverlieHolland, Richard for evaluation of recurrent episode of abnormal sensation, initial evaluation was on December 09, 2018.  I have reviewed and summarized the referring note from the referring physician.  She had long history of chronic insomnia, has been taking Baker 0.5 mg at bedtime, she reported recurrent episode of abnormal rising sensation since 2013, it happens randomly, about twice each year, each episode is very similar to each other, the most recent episode was in May 2020.  She describes sudden onset of rising sensation from bilateral feet, as if electric shock, going up to her lower extremity, trunk, then her face, she felt her face numbness, traveling take about few seconds, then disappeared, then recurrent episode again on and off, lasting 30 to 45 minutes, she denied loss of consciousness, denies confusion, there is no weakness, no language difficulty during the spells, she denies any difficulty in between the spells  Over the years, she had cardiac evaluations, reported normal stress testing, normal CT coronary morphology testing.  Laboratory evaluations in 2020 showed normal A1c, elevated LDL 121, negative troponin, normal CBC, BMP  REVIEW OF SYSTEMS: Full 14 system review of systems performed and notable only for as above All other review of systems were negative.  ALLERGIES: Allergies  Allergen  Reactions  . Amoxicillin Itching, Rash and Other (See Comments)    REACTION: "UNABLE TO MOVE EYES"  . Norco [Hydrocodone-Acetaminophen] Itching, Rash and Other (See Comments)    REACTION: "UNABLE TO MOVE EYES"    HOME MEDICATIONS: Current Outpatient Medications  Medication Sig Dispense Refill  . ALPRAZolam (Baker) 0.5 MG tablet Take 0.5 mg by mouth at bedtime as needed for sleep.      No current facility-administered medications for this visit.     PAST MEDICAL HISTORY: Past Medical History:  Diagnosis Date  . Chest discomfort 06/05/2014   Associated jaw discomfort/ radiation   . Sleeping difficulty     PAST SURGICAL HISTORY: Past Surgical History:  Procedure Laterality Date  . ABLATION    . TUBAL LIGATION      FAMILY HISTORY: Family History  Problem Relation Age of Onset  . Diabetes Mother   . Heart disease Father   . Hypertension Father   . Cancer Maternal Grandfather   . Heart disease Paternal Grandmother   . Heart disease Paternal Grandfather     SOCIAL HISTORY: Social History   Socioeconomic History  . Marital status: Single    Spouse name: Not on file  . Number of children: 2  . Years of education: college  . Highest education level: Professional school degree (e.g., MD, DDS, DVM, JD)  Occupational History  . Occupation: Education officer, communityDentist  Social Needs  . Financial resource strain: Not on file  . Food insecurity    Worry: Not on file    Inability: Not on file  . Transportation needs  Medical: Not on file    Non-medical: Not on file  Tobacco Use  . Smoking status: Never Smoker  . Smokeless tobacco: Never Used  Substance and Sexual Activity  . Alcohol use: Yes    Comment: 1-2 per month  . Drug use: No  . Sexual activity: Not on file  Lifestyle  . Physical activity    Days per week: Not on file    Minutes per session: Not on file  . Stress: Not on file  Relationships  . Social Musician on phone: Not on file    Gets together: Not on file     Attends religious service: Not on file    Active member of club or organization: Not on file    Attends meetings of clubs or organizations: Not on file    Relationship status: Not on file  . Intimate partner violence    Fear of current or ex partner: Not on file    Emotionally abused: Not on file    Physically abused: Not on file    Forced sexual activity: Not on file  Other Topics Concern  . Not on file  Social History Narrative   Lives at home with her family.   Drinks Diet Coke daily.   Right-handed.     PHYSICAL EXAM   Vitals:   12/09/18 1046  BP: 122/72  Pulse: 80  Temp: 97.8 F (36.6 C)  Weight: 134 lb 8 oz (61 kg)  Height: 5\' 2"  (1.575 m)    Not recorded      Body mass index is 24.6 kg/m.  PHYSICAL EXAMNIATION:  Gen: NAD, conversant, well nourised, well groomed                     Cardiovascular: Regular rate rhythm, no peripheral edema, warm, nontender. Eyes: Conjunctivae clear without exudates or hemorrhage Neck: Supple, no carotid bruits. Pulmonary: Clear to auscultation bilaterally   NEUROLOGICAL EXAM:  MENTAL STATUS: Speech:    Speech is normal; fluent and spontaneous with normal comprehension.  Cognition:     Orientation to time, place and person     Normal recent and remote memory     Normal Attention span and concentration     Normal Language, naming, repeating,spontaneous speech     Fund of knowledge   CRANIAL NERVES: CN II: Visual fields are full to confrontation.  Pupils are round equal and briskly reactive to light. CN III, IV, VI: extraocular movement are normal. No ptosis. CN V: Facial sensation is intact to pinprick in all 3 divisions bilaterally. Corneal responses are intact.  CN VII: Face is symmetric with normal eye closure and smile. CN VIII: Hearing is normal to causal conversation. CN XI: Head turning and shoulder shrug are intact   MOTOR: There is no pronator drift of out-stretched arms. Muscle bulk and tone are  normal. Muscle strength is normal.  REFLEXES: Reflexes are 2+ and symmetric at the biceps, triceps, knees, and ankles. Plantar responses are flexor.  SENSORY: Intact to light touch, pinprick, positional sensation and vibratory sensation are intact in fingers and toes.  COORDINATION: Rapid alternating movements and fine finger movements are intact. There is no dysmetria on finger-to-nose and heel-knee-shin.    GAIT/STANCE: Posture is normal. Gait is steady with normal steps, base, arm swing, and turning. Heel and toe walking are normal. Tandem gait is normal.  Romberg is absent.   DIAGNOSTIC DATA (LABS, IMAGING, TESTING) - I reviewed patient records, labs,  notes, testing and imaging myself where available.   ASSESSMENT AND PLAN  Judith Baker is a 53 y.o. female   Recurrent episode of abnormal rising sensation,  With no loss of consciousness, less suggestive of seizure,  Will still proceed with MRI of the brain  EEG  Judith Baker 0.5 mg half to 1 tablet as needed Chronic insomnia  Trazodone 50 mg daily  Baker as needed  Marcial Pacas, M.D. Ph.D.  [facility] [address] [place], [place] [address] Ph: [phone] Fax: [phone]  CC: Belva Crome, MD, Molli Posey, MD

## 2018-12-13 ENCOUNTER — Telehealth: Payer: Self-pay | Admitting: Neurology

## 2018-12-13 NOTE — Telephone Encounter (Signed)
spoke to the patient she stated she is going to call back  [place]: [id number] (Exp. 12/13/18 to 01/27/19)

## 2019-01-10 ENCOUNTER — Ambulatory Visit: Payer: 59 | Admitting: Neurology

## 2019-01-10 ENCOUNTER — Other Ambulatory Visit: Payer: Self-pay

## 2019-01-10 DIAGNOSIS — R2990 Unspecified symptoms and signs involving the nervous system: Secondary | ICD-10-CM

## 2019-01-10 DIAGNOSIS — F5104 Psychophysiologic insomnia: Secondary | ICD-10-CM

## 2019-01-10 DIAGNOSIS — R202 Paresthesia of skin: Secondary | ICD-10-CM

## 2019-01-11 ENCOUNTER — Other Ambulatory Visit: Payer: Self-pay

## 2019-01-11 DIAGNOSIS — Z20822 Contact with and (suspected) exposure to covid-19: Secondary | ICD-10-CM

## 2019-01-11 NOTE — Telephone Encounter (Signed)
Pt returned call to schedule MRI 

## 2019-01-12 LAB — NOVEL CORONAVIRUS, NAA: SARS-CoV-2, NAA: NOT DETECTED

## 2019-01-12 NOTE — Telephone Encounter (Signed)
I called this patient on Nov 17th and Today and LVM. She sent message today stating she has not received any missed calls. I also sent an email. DW

## 2019-01-13 ENCOUNTER — Other Ambulatory Visit: Payer: Self-pay | Admitting: Neurology

## 2019-01-13 NOTE — Procedures (Signed)
   HISTORY: 53 year old female, with episode of cyclic electronic sensation throughout her body  TECHNIQUE:  This is a routine 16 channel EEG recording with one channel devoted to a limited EKG recording.  It was performed during wakefulness, drowsiness and asleep.  Hyperventilation and photic stimulation were performed as activating procedures.  There are minimum muscle and movement artifact noted.  Upon maximum arousal, posterior dominant waking rhythm consistent of rhythmic alpha range activity, with frequency of 10 Hz. Activities are symmetric over the bilateral posterior derivations and attenuated with eye opening.  Hyperventilation produced mild/moderate buildup with higher amplitude and the slower activities noted.  Photic stimulation did not alter the tracing.  During EEG recording, patient developed drowsiness and no deeper stage of sleep was achieved During EEG recording, there was no epileptiform discharge noted.  EKG demonstrate sinus rhythm, with heart rate of 60 bpm  CONCLUSION: This is a  normal awake EEG.  There is no electrodiagnostic evidence of epileptiform discharge.  Marcial Pacas, M.D. Ph.D.  [facility] [place], [place] [address] Phone: [phone] Fax:      [phone]

## 2019-01-13 NOTE — Telephone Encounter (Signed)
I called the patient again and LVM. I also sent another email. DW

## 2019-01-19 ENCOUNTER — Ambulatory Visit (INDEPENDENT_AMBULATORY_CARE_PROVIDER_SITE_OTHER): Payer: 59

## 2019-01-19 DIAGNOSIS — R202 Paresthesia of skin: Secondary | ICD-10-CM | POA: Diagnosis not present

## 2019-01-19 DIAGNOSIS — F5104 Psychophysiologic insomnia: Secondary | ICD-10-CM

## 2019-01-20 ENCOUNTER — Other Ambulatory Visit: Payer: Self-pay

## 2019-02-23 ENCOUNTER — Ambulatory Visit: Payer: 59 | Attending: Internal Medicine

## 2019-02-23 ENCOUNTER — Other Ambulatory Visit: Payer: 59

## 2019-02-23 ENCOUNTER — Other Ambulatory Visit: Payer: Self-pay

## 2019-02-23 DIAGNOSIS — Z20822 Contact with and (suspected) exposure to covid-19: Secondary | ICD-10-CM

## 2019-02-25 LAB — NOVEL CORONAVIRUS, NAA: SARS-CoV-2, NAA: DETECTED — AB

## 2019-02-27 ENCOUNTER — Encounter: Payer: Self-pay | Admitting: Adult Health

## 2019-02-27 ENCOUNTER — Telehealth: Payer: Self-pay | Admitting: Adult Health

## 2019-02-27 NOTE — Telephone Encounter (Signed)
Called patient and LMOM that I am reaching out to f/u on her recent covid 19 positivity and whether or not she is eligible for monoclonal antibody therapy.  Requested call back.  My chart message sent.  Lillard Anes, NP

## 2019-06-20 ENCOUNTER — Other Ambulatory Visit: Payer: 59

## 2019-10-13 ENCOUNTER — Other Ambulatory Visit: Payer: 59

## 2019-10-13 ENCOUNTER — Other Ambulatory Visit: Payer: Self-pay

## 2019-10-13 DIAGNOSIS — Z20822 Contact with and (suspected) exposure to covid-19: Secondary | ICD-10-CM

## 2019-10-15 LAB — NOVEL CORONAVIRUS, NAA: SARS-CoV-2, NAA: NOT DETECTED

## 2019-10-15 LAB — SARS-COV-2, NAA 2 DAY TAT

## 2020-02-14 ENCOUNTER — Other Ambulatory Visit: Payer: Self-pay | Admitting: Neurology

## 2020-03-11 ENCOUNTER — Other Ambulatory Visit: Payer: Self-pay | Admitting: Neurology

## 2020-03-20 ENCOUNTER — Other Ambulatory Visit: Payer: Self-pay | Admitting: Neurology

## 2020-03-21 ENCOUNTER — Telehealth: Payer: Self-pay | Admitting: Neurology

## 2020-03-21 NOTE — Telephone Encounter (Signed)
Pt called wanting to speak to the RN regarding her traZODone (DESYREL) 50 MG tablet Please advise.

## 2020-03-21 NOTE — Telephone Encounter (Signed)
Patient called back and I advised her the refill was denied because she's not been seen since 12/2018, no follow up on file. I advised she can follow up here yearly to get trazodone refills or she may ask PCP to refill. She stated that she feels Dr Terrace Arabia worked her symptoms up thoroughly, symptoms come and go randomly. She will ask PCP,  verbalized understanding, appreciation.

## 2020-03-21 NOTE — Telephone Encounter (Signed)
LVM requesting call back.

## 2020-08-02 DIAGNOSIS — F419 Anxiety disorder, unspecified: Secondary | ICD-10-CM | POA: Insufficient documentation

## 2020-12-22 ENCOUNTER — Ambulatory Visit (HOSPITAL_COMMUNITY)
Admission: EM | Admit: 2020-12-22 | Discharge: 2020-12-22 | Disposition: A | Discharge status: Home / Self Care | Payer: 59 | Attending: Family Medicine | Admitting: Family Medicine

## 2020-12-22 ENCOUNTER — Other Ambulatory Visit: Payer: Self-pay

## 2020-12-22 ENCOUNTER — Encounter (HOSPITAL_COMMUNITY): Payer: Self-pay | Admitting: Emergency Medicine

## 2020-12-22 DIAGNOSIS — S61011A Laceration without foreign body of right thumb without damage to nail, initial encounter: Secondary | ICD-10-CM | POA: Diagnosis not present

## 2020-12-22 MED ORDER — DOXYCYCLINE HYCLATE 100 MG PO CAPS: 100.0000 mg | ORAL_CAPSULE | Freq: Two times a day (BID) | ORAL | 0 refills | Status: DC

## 2020-12-22 MED ORDER — LIDOCAINE-EPINEPHRINE-TETRACAINE (LET) TOPICAL GEL
3.0000 mL | Freq: Once | TOPICAL | Status: AC
  Administered 2020-12-22: 3 mL via TOPICAL

## 2020-12-22 MED ORDER — LIDOCAINE-EPINEPHRINE-TETRACAINE (LET) TOPICAL GEL: 3.0000 mL | Freq: Once | TOPICAL | Status: DC

## 2020-12-22 MED ORDER — LIDOCAINE-EPINEPHRINE-TETRACAINE (LET) TOPICAL GEL
TOPICAL | Status: AC
  Filled 2020-12-22: qty 3

## 2020-12-22 MED ORDER — HIBICLENS 4 % EX LIQD: Freq: Every day | CUTANEOUS | 0 refills | Status: DC | PRN

## 2020-12-22 NOTE — ED Provider Notes (Signed)
[facility]    CSN: [id number] Arrival date & time: 12/22/20  1605      History   Chief Complaint Chief Complaint  Patient presents with   Laceration    HPI Judith Baker is a 55 y.o. female.   Patient presenting today with a large avulsion laceration to right thumb that occurred today while she was using a slicer.  She states it has been bleeding significantly despite pressure dressing for the last few hours.  She denies significant pain, no decreased range of motion, numbness, tingling.  Has wash the area with soap and water and has been keeping pressure dressing in place.  Up-to-date on tetanus shot per patient.   Past Medical History:  Diagnosis Date   Chest discomfort 06/05/2014   Associated jaw discomfort/ radiation    Sleeping difficulty     Patient Active Problem List   Diagnosis Date Noted   Paresthesia 12/09/2018   Chronic insomnia 12/09/2018   Chest discomfort 06/05/2014    Past Surgical History:  Procedure Laterality Date   ABLATION     TUBAL LIGATION      OB History   No obstetric history on file.      Home Medications    Prior to Admission medications   Medication Sig Start Date End Date Taking? Authorizing Provider  chlorhexidine (HIBICLENS) 4 % external liquid Apply topically daily as needed. 12/22/20  Yes Particia Nearing, PA-C  doxycycline (VIBRAMYCIN) 100 MG capsule Take 1 capsule (100 mg total) by mouth 2 (two) times daily. 12/22/20  Yes Particia Nearing, PA-C  traZODone (DESYREL) 50 MG tablet Take 1 tablet (50 mg total) by mouth at bedtime. Must be seen for further refills. 02/14/20  Yes Levert Feinstein, MD  ALPRAZolam Prudy Feeler) 0.5 MG tablet Take 0.5 mg by mouth at bedtime as needed for sleep.     [provider]    Family History Family History  Problem Relation Age of Onset   Diabetes Mother    Heart disease Father    Hypertension Father    Cancer Maternal Grandfather    Heart disease Paternal  Grandmother    Heart disease Paternal Grandfather     Social History Social History   Tobacco Use   Smoking status: Never   Smokeless tobacco: Never  Vaping Use   Vaping Use: Never used  Substance Use Topics   Alcohol use: Yes    Comment: 1-2 per month   Drug use: No     Allergies   Amoxicillin and Norco [hydrocodone-acetaminophen]   Review of Systems Review of Systems Per HPI  Physical Exam Triage Vital Signs ED Triage Vitals  Enc Vitals Group     BP 12/22/20 1618 (!) 150/89     Pulse Rate 12/22/20 1618 65     Resp 12/22/20 1618 18     Temp 12/22/20 1618 98.3 F (36.8 C)     Temp Source 12/22/20 1618 Oral     SpO2 12/22/20 1618 95 %     Weight --      Height --      Head Circumference --      Peak Flow --      Pain Score 12/22/20 1615 0     Pain Loc --      Pain Edu? --      Excl. in GC? --    No data found.  Updated Vital Signs BP (!) 150/89 (BP Location: Left Arm)   Pulse 65  Temp 98.3 F (36.8 C) (Oral)   Resp 18   SpO2 95%   Visual Acuity Right Eye Distance:   Left Eye Distance:   Bilateral Distance:    Right Eye Near:   Left Eye Near:    Bilateral Near:     Physical Exam Vitals and nursing note reviewed.  Constitutional:      Appearance: Normal appearance. She is not ill-appearing.  HENT:     Head: Atraumatic.  Eyes:     Extraocular Movements: Extraocular movements intact.     Conjunctiva/sclera: Conjunctivae normal.  Cardiovascular:     Rate and Rhythm: Normal rate and regular rhythm.     Heart sounds: Normal heart sounds.  Pulmonary:     Effort: Pulmonary effort is normal.     Breath sounds: Normal breath sounds.  Musculoskeletal:        General: Normal range of motion.     Cervical back: Normal range of motion and neck supple.     Comments: Range of motion full and intact right thumb  Skin:    General: Skin is warm.     Comments: Large avulsion to the lateral aspect of the right thumb with moderate bleeding.  No damage  to the nailbed.  Neurological:     Mental Status: She is alert and oriented to person, place, and time.  Psychiatric:        Mood and Affect: Mood normal.        Thought Content: Thought content normal.        Judgment: Judgment normal.   UC Treatments / Results  Labs (all labs ordered are listed, but only abnormal results are displayed) Labs Reviewed - No data to display  EKG   Radiology No results found.  Procedures Procedures (including critical care time)  Medications Ordered in UC Medications  lidocaine-EPINEPHrine-tetracaine (LET) topical gel (3 mLs Topical Given 12/22/20 1632)    Initial Impression / Assessment and Plan / UC Course  I have reviewed the triage vital signs and the nursing notes.  Pertinent labs & imaging results that were available during my care of the patient were reviewed by me and considered in my medical decision making (see chart for details).     Let gel applied in triage with pressure dressing.  This slowed the bleeding mildly but still with fairly moderate bleeding from the area.  Another application of let was applied.  This control the bleeding almost completely so pressure dressing was placed, antibiotics and Hibiclens sent and wound care reviewed at length.  Discussed strict return precautions.  50 minutes spent today in direct patient care, education  Final Clinical Impressions(s) / UC Diagnoses   Final diagnoses:  Laceration of right thumb without foreign body without damage to nail, initial encounter   Discharge Instructions   None    ED Prescriptions     Medication Sig Dispense Auth. Provider   doxycycline (VIBRAMYCIN) 100 MG capsule Take 1 capsule (100 mg total) by mouth 2 (two) times daily. 14 capsule Particia Nearing, [place]   chlorhexidine (HIBICLENS) 4 % external liquid Apply topically daily as needed. 120 mL Particia Nearing, [place]      PDMP not reviewed this encounter.   Particia Nearing,  [place] 12/22/20 [phone]

## 2020-12-22 NOTE — ED Triage Notes (Signed)
Injured right thumb with slicer, complete avulsion of tissue.  Fleet Contras, pa saw patient in intake room

## 2022-01-31 ENCOUNTER — Encounter: Payer: Self-pay | Admitting: Emergency Medicine

## 2022-01-31 ENCOUNTER — Ambulatory Visit
Admission: EM | Admit: 2022-01-31 | Discharge: 2022-01-31 | Disposition: A | Discharge status: Home / Self Care | Payer: 59 | Attending: Family Medicine | Admitting: Family Medicine

## 2022-01-31 DIAGNOSIS — N3001 Acute cystitis with hematuria: Secondary | ICD-10-CM | POA: Diagnosis not present

## 2022-01-31 DIAGNOSIS — S39012A Strain of muscle, fascia and tendon of lower back, initial encounter: Secondary | ICD-10-CM

## 2022-01-31 LAB — POCT URINALYSIS DIP (MANUAL ENTRY)
Glucose, UA: NEGATIVE mg/dL
Ketones, POC UA: NEGATIVE mg/dL
Nitrite, UA: NEGATIVE
Protein Ur, POC: >=300 mg/dL — AB
Spec Grav, UA: >=1.03 — AB (ref 1.010–1.025)
Urobilinogen, UA: 0.2 E.U./dL
pH, UA: 6 (ref 5.0–8.0)

## 2022-01-31 MED ORDER — SULFAMETHOXAZOLE-TRIMETHOPRIM 800-160 MG PO TABS: 1.0000 | ORAL_TABLET | Freq: Two times a day (BID) | ORAL | 0 refills | Status: DC

## 2022-01-31 MED ORDER — BACLOFEN 10 MG PO TABS: 10.0000 mg | ORAL_TABLET | Freq: Three times a day (TID) | ORAL | 0 refills | Status: DC

## 2022-01-31 NOTE — ED Provider Notes (Signed)
[facility]    CSN: [id number] Arrival date & time: 01/31/22  0801      History   Chief Complaint Chief Complaint  Patient presents with   Cloudy Urine    HPI Judith Baker is a 56 y.o. female.   HPI 56 year old female presents with cloudy urine for 1 day.  Past Medical History:  Diagnosis Date   Chest discomfort 06/05/2014   Associated jaw discomfort/ radiation    Sleeping difficulty     Patient Active Problem List   Diagnosis Date Noted   Paresthesia 12/09/2018   Chronic insomnia 12/09/2018   Chest discomfort 06/05/2014    Past Surgical History:  Procedure Laterality Date   ABLATION     TUBAL LIGATION      OB History   No obstetric history on file.      Home Medications    Prior to Admission medications   Medication Sig Start Date End Date Taking? Authorizing Provider  baclofen (LIORESAL) 10 MG tablet Take 1 tablet (10 mg total) by mouth 3 (three) times daily. 01/31/22  Yes Trevor Iha, FNP  sulfamethoxazole-trimethoprim (BACTRIM DS) 800-160 MG tablet Take 1 tablet by mouth 2 (two) times daily for 7 days. 01/31/22 02/07/22 Yes Trevor Iha, FNP  ALPRAZolam Prudy Feeler) 0.5 MG tablet Take 0.5 mg by mouth at bedtime as needed for sleep.     [provider]  chlorhexidine (HIBICLENS) 4 % external liquid Apply topically daily as needed. 12/22/20   Particia Nearing, PA-C  doxycycline (VIBRAMYCIN) 100 MG capsule Take 1 capsule (100 mg total) by mouth 2 (two) times daily. 12/22/20   Particia Nearing, PA-C  traZODone (DESYREL) 50 MG tablet Take 1 tablet (50 mg total) by mouth at bedtime. Must be seen for further refills. 02/14/20   Levert Feinstein, MD    Family History Family History  Problem Relation Age of Onset   Diabetes Mother    Heart disease Father    Hypertension Father    Cancer Maternal Grandfather    Heart disease Paternal Grandmother    Heart disease Paternal Grandfather     Social History Social History    Tobacco Use   Smoking status: Never   Smokeless tobacco: Never  Vaping Use   Vaping Use: Never used  Substance Use Topics   Alcohol use: Yes    Comment: 1-2 per month   Drug use: No     Allergies   Amoxicillin and Norco [hydrocodone-acetaminophen]   Review of Systems Review of Systems  Genitourinary:        Patient reports cloudy urine, urinary pressure and low back pain for 1 day, denies [place] area     Physical Exam Triage Vital Signs ED Triage Vitals  Enc Vitals Group     BP 01/31/22 0813 132/83     Pulse Rate 01/31/22 0813 67     Resp 01/31/22 0813 18     Temp 01/31/22 0813 98.7 F (37.1 C)     Temp Source 01/31/22 0813 Oral     SpO2 01/31/22 0813 98 %     Weight 01/31/22 0815 128 lb (58.1 kg)     Height 01/31/22 0815 5\' 3"  (1.6 m)     Head Circumference --      Peak Flow --      Pain Score 01/31/22 0814 1     Pain Loc --      Pain Edu? --      Excl. in GC? --  No data found.  Updated Vital Signs BP 132/83 (BP Location: Right Arm)   Pulse 67   Temp 98.7 F (37.1 C) (Oral)   Resp 18   Ht 5\' 3"  (1.6 m)   Wt 128 lb (58.1 kg)   SpO2 98%   BMI 22.67 kg/m   Visual Acuity Right Eye Distance:   Left Eye Distance:   Bilateral Distance:    Right Eye Near:   Left Eye Near:    Bilateral Near:     Physical Exam Vitals and nursing note reviewed.  Constitutional:      Appearance: Normal appearance. She is normal weight.  HENT:     Head: Normocephalic and atraumatic.     Mouth/Throat:     Mouth: Mucous membranes are moist.     Pharynx: Oropharynx is clear.  Eyes:     Extraocular Movements: Extraocular movements intact.     Conjunctiva/sclera: Conjunctivae normal.     Pupils: Pupils are equal, round, and reactive to light.  Cardiovascular:     Rate and Rhythm: Normal rate and regular rhythm.     Pulses: Normal pulses.     Heart sounds: Normal heart sounds.  Pulmonary:     Effort: Pulmonary effort is normal.     Breath sounds: Normal breath  sounds.     Comments: No adventitious breath sounds noted Abdominal:     Tenderness: There is no right CVA tenderness or left CVA tenderness.  Musculoskeletal:        General: Normal range of motion.     Cervical back: Normal range of motion and neck supple.  Skin:    General: Skin is warm and dry.  Neurological:     General: No focal deficit present.     Mental Status: She is alert and oriented to person, place, and time.      UC Treatments / Results  Labs (all labs ordered are listed, but only abnormal results are displayed) Labs Reviewed  POCT URINALYSIS DIP (MANUAL ENTRY) - Abnormal; Notable for the following components:      Result Value   Color, UA brown (*)    Clarity, UA cloudy (*)    Bilirubin, UA small (*)    Spec Grav, UA >=1.030 (*)    Blood, UA large (*)    Protein Ur, POC >=300 (*)    Leukocytes, UA Large (3+) (*)    All other components within normal limits    EKG   Radiology No results found.  Procedures Procedures (including critical care time)  Medications Ordered in UC Medications - No data to display  Initial Impression / Assessment and Plan / UC Course  I have reviewed the triage vital signs and the nursing notes.  Pertinent labs & imaging results that were available during my care of the patient were reviewed by me and considered in my medical decision making (see chart for details).     MDM: 1.  Acute cystitis with hematuria-Rx'd Bactrim; 2.  Lumbar strain, initial encounter-Rx'd Baclofen, patient works as a and believes the muscle relaxer will help her lower back as well. Instructed patient to take medications as directed with food to completion.  Encouraged patient to increase daily water intake to 64 ounces per day while taking these medications.  Advised if symptoms worsen and/or unresolved please follow-up with PCP or here for further evaluation.  Patient discharged home, hemodynamically stable. Final Clinical Impressions(s) / UC  Diagnoses   Final diagnoses:  Lumbar strain, initial encounter  Acute cystitis with hematuria     Discharge Instructions      Instructed patient to take medications as directed with food to completion.  Encouraged patient to increase daily water intake to 64 ounces per day while taking these medications.  Advised if symptoms worsen and/or unresolved please follow-up with PCP or here for further evaluation.     ED Prescriptions     Medication Sig Dispense Auth. Provider   sulfamethoxazole-trimethoprim (BACTRIM DS) 800-160 MG tablet Take 1 tablet by mouth 2 (two) times daily for 7 days. 14 tablet Trevor Iha, FNP   baclofen (LIORESAL) 10 MG tablet Take 1 tablet (10 mg total) by mouth 3 (three) times daily. 30 each Trevor Iha, FNP      PDMP not reviewed this encounter.   Trevor Iha, FNP 01/31/22 [phone]

## 2022-01-31 NOTE — Discharge Instructions (Addendum)
Instructed patient to take medications as directed with food to completion.  Encouraged patient to increase daily water intake to 64 ounces per day while taking these medications.  Advised if symptoms worsen and/or unresolved please follow-up with PCP or here for further evaluation. 

## 2022-01-31 NOTE — ED Triage Notes (Signed)
Patient c/o cloudy urine, urinary pressure and low back pain x 1 day.  Denies any hematuria.  Patient has taken Advil for the discomfort.

## 2022-02-02 ENCOUNTER — Ambulatory Visit
Admission: EM | Admit: 2022-02-02 | Discharge: 2022-02-02 | Disposition: A | Discharge status: Home / Self Care | Payer: 59 | Attending: Physician Assistant | Admitting: Physician Assistant

## 2022-02-02 ENCOUNTER — Telehealth: Payer: Self-pay

## 2022-02-02 ENCOUNTER — Other Ambulatory Visit: Payer: Self-pay

## 2022-02-02 DIAGNOSIS — R350 Frequency of micturition: Secondary | ICD-10-CM | POA: Diagnosis present

## 2022-02-02 DIAGNOSIS — R3915 Urgency of urination: Secondary | ICD-10-CM | POA: Insufficient documentation

## 2022-02-02 DIAGNOSIS — Z87440 Personal history of urinary (tract) infections: Secondary | ICD-10-CM | POA: Diagnosis present

## 2022-02-02 LAB — POCT URINALYSIS DIP (MANUAL ENTRY)
Bilirubin, UA: NEGATIVE
Glucose, UA: NEGATIVE mg/dL
Ketones, POC UA: NEGATIVE mg/dL
Leukocytes, UA: NEGATIVE
Nitrite, UA: NEGATIVE
Protein Ur, POC: NEGATIVE mg/dL
Spec Grav, UA: 1.01 (ref 1.010–1.025)
Urobilinogen, UA: 0.2 E.U./dL
pH, UA: 5.5 (ref 5.0–8.0)

## 2022-02-02 MED ORDER — NITROFURANTOIN MONOHYD MACRO 100 MG PO CAPS: 100.0000 mg | ORAL_CAPSULE | Freq: Two times a day (BID) | ORAL | 0 refills | Status: DC

## 2022-02-02 NOTE — ED Provider Notes (Signed)
[facility]    CSN: [id number] Arrival date & time: 02/02/22  1523      History   Chief Complaint Chief Complaint  Patient presents with   UTI    HPI Judith Baker is a 56 y.o. female.   Patient presents today with a several day history of UTI symptoms.  She was seen by our clinic on 01/31/2022 at which point she was started on Bactrim DS.  She reports completing course of medication initially had some improvement but did not have resolution of symptoms.  She then had worsening of frequency/urgency prompting her to call our clinic.  Unfortunately, something happened to her urine culture and this was not processed.  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  She denies additional antibiotics in the past 90 days.  Denies any recent urogenital procedure, self-catheterization, history of nephrolithiasis.  Denies any fever, nausea, vomiting, body aches.    Past Medical History:  Diagnosis Date   Chest discomfort 06/05/2014   Associated jaw discomfort/ radiation    Sleeping difficulty     Patient Active Problem List   Diagnosis Date Noted   Paresthesia 12/09/2018   Chronic insomnia 12/09/2018   Chest discomfort 06/05/2014    Past Surgical History:  Procedure Laterality Date   ABLATION     TUBAL LIGATION      OB History   No obstetric history on file.      Home Medications    Prior to Admission medications   Medication Sig Start Date End Date Taking? Authorizing Provider  nitrofurantoin, macrocrystal-monohydrate, (MACROBID) 100 MG capsule Take 1 capsule (100 mg total) by mouth 2 (two) times daily. 02/02/22  Yes Pansy Ostrovsky K, PA-C  baclofen (LIORESAL) 10 MG tablet Take 1 tablet (10 mg total) by mouth 3 (three) times daily. 01/31/22   Trevor Iha, FNP    Family History Family History  Problem Relation Age of Onset   Diabetes Mother    Heart disease Father    Hypertension Father    Cancer Maternal Grandfather    Heart disease  Paternal Grandmother    Heart disease Paternal Grandfather     Social History Social History   Tobacco Use   Smoking status: Never   Smokeless tobacco: Never  Vaping Use   Vaping Use: Never used  Substance Use Topics   Alcohol use: Yes    Comment: 1-2 per month   Drug use: No     Allergies   Amoxicillin and Norco [hydrocodone-acetaminophen]   Review of Systems Review of Systems  Constitutional:  Positive for activity change. Negative for appetite change, fatigue and fever.  Gastrointestinal:  Positive for abdominal pain. Negative for diarrhea, nausea and vomiting.  Genitourinary:  Positive for frequency and urgency. Negative for dysuria, vaginal bleeding, vaginal discharge and vaginal pain.     Physical Exam Triage Vital Signs ED Triage Vitals  Enc Vitals Group     BP 02/02/22 1538 135/87     Pulse Rate 02/02/22 1538 69     Resp 02/02/22 1538 20     Temp 02/02/22 1538 98.4 F (36.9 C)     Temp Source 02/02/22 1538 Oral     SpO2 02/02/22 1538 98 %     Weight 02/02/22 1539 128 lb (58.1 kg)     Height 02/02/22 1539 5\' 3"  (1.6 m)     Head Circumference --      Peak Flow --      Pain Score  02/02/22 1536 3     Pain Loc --      Pain Edu? --      Excl. in GC? --    No data found.  Updated Vital Signs BP 135/87 (BP Location: Right Arm)   Pulse 69   Temp 98.4 F (36.9 C) (Oral)   Resp 20   Ht 5\' 3"  (1.6 m)   Wt 128 lb (58.1 kg)   SpO2 98%   BMI 22.67 kg/m   Visual Acuity Right Eye Distance:   Left Eye Distance:   Bilateral Distance:    Right Eye Near:   Left Eye Near:    Bilateral Near:     Physical Exam Vitals reviewed.  Constitutional:      General: She is awake. She is not in acute distress.    Appearance: Normal appearance. She is well-developed. She is not ill-appearing.     Comments: Very pleasant female appears stated age in no acute distress sitting comfortably in exam room  HENT:     Head: Normocephalic and atraumatic.  Cardiovascular:      Rate and Rhythm: Normal rate and regular rhythm.     Heart sounds: Normal heart sounds, S1 normal and S2 normal. No murmur heard. Pulmonary:     Effort: Pulmonary effort is normal.     Breath sounds: Normal breath sounds. No wheezing, rhonchi or rales.     Comments: Clear to auscultation bilaterally Abdominal:     General: Bowel sounds are normal.     Palpations: Abdomen is soft.     Tenderness: There is no abdominal tenderness. There is no right CVA tenderness, left CVA tenderness, guarding or rebound.     Comments: Benign abdominal exam  Psychiatric:        Behavior: Behavior is cooperative.      UC Treatments / Results  Labs (all labs ordered are listed, but only abnormal results are displayed) Labs Reviewed  URINE CULTURE  POCT URINALYSIS DIP (MANUAL ENTRY)    EKG   Radiology No results found.  Procedures Procedures (including critical care time)  Medications Ordered in UC Medications - No data to display  Initial Impression / Assessment and Plan / UC Course  I have reviewed the triage vital signs and the nursing notes.  Pertinent labs & imaging results that were available during my care of the patient were reviewed by me and considered in my medical decision making (see chart for details).     Patient is well-appearing, afebrile, nontoxic, nontachycardic.  Her urine appears improved but given that she has a history of resistant infections and continues to have significant symptoms we will treat with Macrobid.  Urine culture was obtained today and we discussed that we will ensure this gets sent off.  She is to monitor her MyChart for these results.  We will contact her if we need to arranged different treatment based on these results.  She is to push fluids.  Discussed that if she has any worsening or changing symptoms including abdominal pain, pelvic pain, fever, nausea, vomiting she needs to be seen immediately.  Strict return precautions given.  Final  Clinical Impressions(s) / UC Diagnoses   Final diagnoses:  Urinary urgency  Urinary frequency  History of UTI     Discharge Instructions      Start Macrobid twice a day for 7 days.  We will contact you if we need to arrange additional treatment based on your culture results.  Please monitor your MyChart for  these results.  Push fluids.  If you develop any additional or worsening symptoms including blood in your urine, nausea, vomiting, fever, weakness you need to be seen immediately.     ED Prescriptions     Medication Sig Dispense Auth. Provider   nitrofurantoin, macrocrystal-monohydrate, (MACROBID) 100 MG capsule Take 1 capsule (100 mg total) by mouth 2 (two) times daily. 14 capsule Isamar Wellbrock K, PA-C      PDMP not reviewed this encounter.   Jeani Hawking, PA-C 02/02/22 1600

## 2022-02-02 NOTE — ED Triage Notes (Signed)
Pt presents to Urgent Care with c/o unresolved s/s of UTI that was diagnosed 01/31/22. Pt has had a total of 6 Bactrim and is concerned that antibiotic is not taking care of infection. Urine culture was not done on 01/31/22.

## 2022-02-02 NOTE — Discharge Instructions (Signed)
Start Macrobid twice a day for 7 days.  We will contact you if we need to arrange additional treatment based on your culture results.  Please monitor your MyChart for these results.  Push fluids.  If you develop any additional or worsening symptoms including blood in your urine, nausea, vomiting, fever, weakness you need to be seen immediately.

## 2022-02-02 NOTE — Telephone Encounter (Signed)
Pt calls to report that UTI s/s have not resolved after taking 6 Bactrim prescribed by M. Ave Filter, FNP on 01/31/22. She is wondering about urine culture results, stating that she suspects she needs another antibiotic. Noticed on ED narrator that no urine culture was ordered/done on 01/31/22. Discussed w/ E. Raspet, PA. She states pt can come in to give another urine sample to send off for culture and await results to see if antibiotic needs to be changed or she can come in to be evaluated again. Pt elects to come in and be seen at [facility] today.

## 2022-02-04 LAB — URINE CULTURE: Culture: NO GROWTH

## 2022-02-24 ENCOUNTER — Ambulatory Visit
Admission: EM | Admit: 2022-02-24 | Discharge: 2022-02-24 | Disposition: A | Discharge status: Home / Self Care | Payer: 59 | Attending: Emergency Medicine | Admitting: Emergency Medicine

## 2022-02-24 DIAGNOSIS — N390 Urinary tract infection, site not specified: Secondary | ICD-10-CM | POA: Diagnosis not present

## 2022-02-24 DIAGNOSIS — Z87440 Personal history of urinary (tract) infections: Secondary | ICD-10-CM | POA: Insufficient documentation

## 2022-02-24 LAB — POCT URINALYSIS DIP (MANUAL ENTRY)
Bilirubin, UA: NEGATIVE
Glucose, UA: NEGATIVE mg/dL
Ketones, POC UA: NEGATIVE mg/dL
Nitrite, UA: NEGATIVE
Protein Ur, POC: NEGATIVE mg/dL
Spec Grav, UA: 1.025 (ref 1.010–1.025)
Urobilinogen, UA: 0.2 E.U./dL
pH, UA: 5.5 (ref 5.0–8.0)

## 2022-02-24 MED ORDER — FOSFOMYCIN TROMETHAMINE 3 G PO PACK: 3.0000 g | PACK | Freq: Once | ORAL | 0 refills | Status: AC

## 2022-02-24 MED ORDER — CEFTRIAXONE SODIUM 1 G IJ SOLR
1000.0000 mg | Freq: Once | INTRAMUSCULAR | Status: AC
  Administered 2022-02-24: 1000 mg via INTRAMUSCULAR

## 2022-02-24 NOTE — ED Triage Notes (Signed)
Pt c/o UTI sxs since yesterday. Low back pain as well. Was just seen 3 weeks ago for similar sxs.

## 2022-02-24 NOTE — ED Provider Notes (Signed)
[facility]    CSN: [id number] Arrival date & time: 02/24/22  1730    HISTORY   Chief Complaint  Patient presents with   Urinary Tract Infection   HPI Judith Baker is a pleasant, 57 y.o. female who presents to urgent care today. Patient complains with burning with urination, cloudy urine and abnormal urine odor since yesterday, also complaining of lower back pain.  Patient states she was seen December 29 for this issue and was prescribed Bactrim.  Patient states she doubled up her dose of Bactrim for the first day and then took 1 tablet twice daily thereafter for total of 7 days of treatment but 2 days after starting Bactrim, returns to urgent care stating that her frequency and urgency had worsened so getting better.  Regrettably, urine culture obtained on December 29 was lost by the lab and therefore not completed.  Urine culture performed on December 31 did not reveal any bacteria.  Provider who saw patient on December 31 changed her antibiotic to [facility].  Patient states she felt better until symptoms returned yesterday.  Patient reports a history of difficult to treat urinary tract infections in the past.  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  Patient denies history of frequent urinary tract infections otherwise.  The history is provided by the patient.   Past Medical History:  Diagnosis Date   Chest discomfort 06/05/2014   Associated jaw discomfort/ radiation    Sleeping difficulty    Patient Active Problem List   Diagnosis Date Noted   Paresthesia 12/09/2018   Chronic insomnia 12/09/2018   Chest discomfort 06/05/2014   Past Surgical History:  Procedure Laterality Date   ABLATION     TUBAL LIGATION     OB History   No obstetric history on file.    Home Medications    Prior to Admission medications   Medication Sig Start Date End Date Taking? Authorizing Provider  baclofen  (LIORESAL) 10 MG tablet Take 1 tablet (10 mg total) by mouth 3 (three) times daily. 01/31/22   Eliezer Lofts, FNP  nitrofurantoin, macrocrystal-monohydrate, (MACROBID) 100 MG capsule Take 1 capsule (100 mg total) by mouth 2 (two) times daily. 02/02/22   Raspet, Derry Skill, PA-C    Family History Family History  Problem Relation Age of Onset   Diabetes Mother    Heart disease Father    Hypertension Father    Cancer Maternal Grandfather    Heart disease Paternal Grandmother    Heart disease Paternal Grandfather    Social History Social History   Tobacco Use   Smoking status: Never   Smokeless tobacco: Never  Vaping Use   Vaping Use: Never used  Substance Use Topics   Alcohol use: Yes    Comment: 1-2 per month   Drug use: No   Allergies   Norco [hydrocodone-acetaminophen] and Amoxicillin  Review of Systems Review of Systems Pertinent findings revealed after performing a 14 point review of systems has been noted in the history of present illness.  Physical Exam Triage Vital Signs ED Triage Vitals  Enc Vitals Group     BP 11/30/20 0827 (!) 147/82     Pulse Rate 11/30/20 0827 72     Resp 11/30/20 0827 18     Temp 11/30/20 0827 98.3 F (36.8 C)     Temp Source 11/30/20 0827 Oral     SpO2 11/30/20 0827 98 %  Weight --      Height --      Head Circumference --      Peak Flow --      Pain Score 11/30/20 0826 5     Pain Loc --      Pain Edu? --      Excl. in [place]? --   No data found.  Updated Vital Signs BP (!) 148/81 (BP Location: Right Arm)   Pulse 82   Temp 99 F (37.2 C) (Oral)   Resp 18   SpO2 98%   Physical Exam Vitals and nursing note reviewed.  Constitutional:      General: She is not in acute distress.    Appearance: Normal appearance. She is not ill-appearing.  HENT:     Head: Normocephalic and atraumatic.  Eyes:     General: Lids are normal.        Right eye: No discharge.        Left eye: No discharge.     Extraocular Movements: Extraocular  movements intact.     Conjunctiva/sclera: Conjunctivae normal.     Right eye: Right conjunctiva is not injected.     Left eye: Left conjunctiva is not injected.  Neck:     Trachea: Trachea and phonation normal.  Cardiovascular:     Rate and Rhythm: Normal rate and regular rhythm.     Pulses: Normal pulses.     Heart sounds: Normal heart sounds. No murmur heard.    No friction rub. No gallop.  Pulmonary:     Effort: Pulmonary effort is normal. No accessory muscle usage, prolonged expiration or respiratory distress.     Breath sounds: Normal breath sounds. No stridor, decreased air movement or transmitted upper airway sounds. No decreased breath sounds, wheezing, rhonchi or rales.  Chest:     Chest wall: No tenderness.  Abdominal:     General: Abdomen is flat. Bowel sounds are normal. There is no distension.     Palpations: Abdomen is soft. There is no mass.     Tenderness: There is no abdominal tenderness. There is no right CVA tenderness, left CVA tenderness, guarding or rebound.     Hernia: No hernia is present.  Musculoskeletal:        General: Normal range of motion.     Cervical back: Normal range of motion and neck supple. Normal range of motion.  Lymphadenopathy:     Cervical: No cervical adenopathy.  Skin:    General: Skin is warm and dry.     Findings: No erythema or rash.  Neurological:     General: No focal deficit present.     Mental Status: She is alert and oriented to person, place, and time.  Psychiatric:        Mood and Affect: Mood normal.        Behavior: Behavior normal.     Visual Acuity Right Eye Distance:   Left Eye Distance:   Bilateral Distance:    Right Eye Near:   Left Eye Near:    Bilateral Near:     UC Couse / Diagnostics / Procedures:     Radiology No results found.  Procedures Procedures (including critical care time) EKG  Pending results:  Labs Reviewed  POCT URINALYSIS DIP (MANUAL ENTRY) - Abnormal; Notable for the following  components:      Result Value   Blood, UA trace-intact (*)    Leukocytes, UA Trace (*)    All other components within normal limits  URINE CULTURE    Medications Ordered in UC: Medications  cefTRIAXone (ROCEPHIN) injection 1,000 mg (1,000 mg Intramuscular Given 02/24/22 1821)    UC Diagnoses / Final Clinical Impressions(s)   I have reviewed the triage vital signs and the nursing notes.  Pertinent labs & imaging results that were available during my care of the patient were reviewed by me and considered in my medical decision making (see chart for details).    Final diagnoses:  Recurrent urinary tract infection   Urine dip today revealed trace blood and white blood cells.  Patient is reporting increased frequency of urination and increased urge to urinate along with low back pain.  We will repeat urine culture today.  Because she has been treated buttocks within the last 30 days, will treat patient empirically for recurrent urinary tract infection and injection of ceftriaxone and a single dose of fosfomycin while we await urine culture results.  Patient advised of risk of cross-reactivity between amoxicillin and ceftriaxone, patient states she believes that her allergic reaction was actually to the hydrocodone she was taking at the same time she was last prescribed amoxicillin because she has tolerated amoxicillin well in the past.  Patient was agreeable to waiting 30 minutes after her injection to be certain she does not have a reaction to ceftriaxone.  Patient tolerated ceftriaxone well.  Patient was deemed safe for discharge to home and advised that we will contact her with results of urine culture once received and provide further treatment as needed based on that result.  Please see discharge instructions below for details of plan of care as provided to patient. ED Prescriptions     Medication Sig Dispense Auth. Provider   fosfomycin (MONUROL) 3 g PACK Take 3 g by mouth once for 1  dose. 3 g Lynden Oxford Scales, PA-C      PDMP not reviewed this encounter.  Disposition Upon Discharge:  Condition: stable for discharge home  Patient presented with concern for an acute illness with associated systemic symptoms and significant discomfort requiring urgent management. In my opinion, this is a condition that a prudent lay person (someone who possesses an average knowledge of health and medicine) may potentially expect to result in complications if not addressed urgently such as respiratory distress, impairment of bodily function or dysfunction of bodily organs.   As such, the patient has been evaluated and assessed, work-up was performed and treatment was provided in alignment with urgent care protocols and evidence based medicine.  Patient/parent/caregiver has been advised that the patient may require follow up for further testing and/or treatment if the symptoms continue in spite of treatment, as clinically indicated and appropriate.  Routine symptom specific, illness specific and/or disease specific instructions were discussed with the patient and/or caregiver at length.  Prevention strategies for avoiding STD exposure were also discussed.  The patient will follow up with their current PCP if and as advised. If the patient does not currently have a PCP we will assist them in obtaining one.   The patient may need specialty follow up if the symptoms continue, in spite of conservative treatment and management, for further workup, evaluation, consultation and treatment as clinically indicated and appropriate.  Patient/parent/caregiver verbalized understanding and agreement of plan as discussed.  All questions were addressed during visit.  Please see discharge instructions below for further details of plan.  Discharge Instructions:   Discharge Instructions      Common causes of urinary tract infections include but are not limited to holding  your urine longer than you  should, squatting instead of sitting down when urinating, sitting around in wet clothing such as a wet swimsuit or gym clothes too long, not emptying your bladder after having sexual intercourse, wiping from back to front instead of front to back after having a bowel movement.     Less common causes of urinary tract infections include but are not limited to anatomical shifts in the location of your bladder or uterus causing obstruction of passage of urine from your bladder to your urethra where your urine comes out or prolapse of your rectum into your vaginal wall.  These less common causes can be evaluated by gynecologist, a urologist or subspecialist called a uro-gynecologist   The urinalysis that we performed in the clinic today was abnormal I recommend that you continue antibiotics at this time.   You received an injection of a strong antibiotic called ceftriaxone during your today to help expedite resolution of your urinary tract infection.   Please pick up and begin taking your prescription for Fosfomycin as soon as possible.  This medication is a single dose.  This medication has been prescribed to cover you while we are waiting for the results of urine culture.  You may require further antibiotics based on the urine culture result.  If you have not had complete resolution of your symptoms after completing treatment as prescribed, please return to urgent care for repeat evaluation or follow-up with your primary care provider.   Thank you for visiting urgent care today.  I appreciate the opportunity to participate in your care.       This office note has been dictated using Teaching laboratory technician.  Unfortunately, this method of dictation can sometimes lead to typographical or grammatical errors.  I apologize for your inconvenience in advance if this occurs.  Please do not hesitate to reach out to me if clarification is needed.       Theadora Rama Scales, [place] 02/24/22  [phone]

## 2022-02-24 NOTE — ED Notes (Signed)
Pt a/o and w/o complaint following Rocephin injection; waiting a total of 30 mins before d/c.

## 2022-02-24 NOTE — Discharge Instructions (Signed)
Common causes of urinary tract infections include but are not limited to holding your urine longer than you should, squatting instead of sitting down when urinating, sitting around in wet clothing such as a wet swimsuit or gym clothes too long, not emptying your bladder after having sexual intercourse, wiping from back to front instead of front to back after having a bowel movement.     Less common causes of urinary tract infections include but are not limited to anatomical shifts in the location of your bladder or uterus causing obstruction of passage of urine from your bladder to your urethra where your urine comes out or prolapse of your rectum into your vaginal wall.  These less common causes can be evaluated by gynecologist, a urologist or subspecialist called a uro-gynecologist   The urinalysis that we performed in the clinic today was abnormal I recommend that you continue antibiotics at this time.   You received an injection of a strong antibiotic called ceftriaxone during your today to help expedite resolution of your urinary tract infection.   Please pick up and begin taking your prescription for Fosfomycin as soon as possible.  This medication is a single dose.  This medication has been prescribed to cover you while we are waiting for the results of urine culture.  You may require further antibiotics based on the urine culture result.  If you have not had complete resolution of your symptoms after completing treatment as prescribed, please return to urgent care for repeat evaluation or follow-up with your primary care provider.   Thank you for visiting urgent care today.  I appreciate the opportunity to participate in your care.

## 2022-02-25 ENCOUNTER — Telehealth: Payer: Self-pay | Admitting: Emergency Medicine

## 2022-02-25 NOTE — Telephone Encounter (Signed)
Call to see how Judith Baker was today & if she had any questions or concerns about the visit yesterday. Call back # left on voice mail. Urine culture still pending

## 2022-02-26 LAB — URINE CULTURE: Culture: NO GROWTH

## 2022-04-02 ENCOUNTER — Encounter: Payer: Self-pay | Admitting: Emergency Medicine

## 2022-04-02 ENCOUNTER — Ambulatory Visit
Admission: EM | Admit: 2022-04-02 | Discharge: 2022-04-02 | Disposition: A | Discharge status: Home / Self Care | Payer: 59 | Attending: Emergency Medicine | Admitting: Emergency Medicine

## 2022-04-02 DIAGNOSIS — H66002 Acute suppurative otitis media without spontaneous rupture of ear drum, left ear: Secondary | ICD-10-CM | POA: Diagnosis not present

## 2022-04-02 MED ORDER — CEFDINIR 300 MG PO CAPS: 300.0000 mg | ORAL_CAPSULE | Freq: Two times a day (BID) | ORAL | 0 refills | Status: AC

## 2022-04-02 MED ORDER — CETIRIZINE HCL 10 MG PO TABS: 10.0000 mg | ORAL_TABLET | Freq: Every day | ORAL | 2 refills | Status: DC

## 2022-04-02 NOTE — Discharge Instructions (Signed)
Please read below to learn more about the medications, dosages and frequencies that I recommend to help alleviate your symptoms and to get you feeling better soon:   Omnicef (cefdinir):  Please take one (1) dose twice daily for 10 days.  This antibiotic can cause upset stomach, this will resolve once antibiotics are complete.  You are welcome to take a probiotic, eat yogurt, take Imodium while taking this medication.  Please avoid other systemic medications such as Maalox, Pepto-Bismol or milk of magnesia as they can interfere with the body's ability to absorb the antibiotics.   Zyrtec (cetirizine): This is an excellent second-generation antihistamine that helps to reduce respiratory inflammatory response to environmental allergens.  In some patients, this medication can cause daytime sleepiness so I recommend that you take 1 tablet daily at bedtime.      Advil, Motrin (ibuprofen): This is a good anti-inflammatory medication which addresses aches, pains and inflammation of the upper airways that causes sinus and nasal congestion as well as in the lower airways which makes your cough feel tight and sometimes burn.  I recommend that you take between 400 to 600 mg every 6-8 hours as needed.      Please follow-up within the next 7-10 days either with your primary care provider or urgent care if your symptoms do not resolve.  If you do not have a primary care provider, we will assist you in finding one.        Thank you for visiting urgent care today.  We appreciate the opportunity to participate in your care.

## 2022-04-02 NOTE — ED Triage Notes (Signed)
Left ear canal  pain  & pressure  Started yesterday morning Pt is getting ready to go out of town on Sunday and flying and wants to be sure  she does not have an ear infection Ibuprofen helps with pain

## 2022-04-02 NOTE — ED Provider Notes (Signed)
[facility]    CSN: [id number] Arrival date & time: 04/02/22  1707    HISTORY   Chief Complaint  Patient presents with   Otalgia    left   HPI Judith Baker is a pleasant, 57 y.o. female who presents to urgent care today. Patient complains of pain in her left ear canal along with pressure sensation.  Patient denies loss of hearing.  Patient states she is going out of town Sunday via airplane and wants to be sure that she does not have an ear infection.  Patient states she never had ear infection in the past.  Patient states her symptoms began yesterday morning.  Patient states ibuprofen helps with pain.  Patient states has not tried anything else to alleviate her symptoms.  The history is provided by the patient.   Past Medical History:  Diagnosis Date   Chest discomfort 06/05/2014   Associated jaw discomfort/ radiation    Sleeping difficulty    Patient Active Problem List   Diagnosis Date Noted   Paresthesia 12/09/2018   Chronic insomnia 12/09/2018   Chest discomfort 06/05/2014   Past Surgical History:  Procedure Laterality Date   ABLATION     TUBAL LIGATION     OB History   No obstetric history on file.    Home Medications    Prior to Admission medications   Medication Sig Start Date End Date Taking? Authorizing Provider  baclofen (LIORESAL) 10 MG tablet Take 1 tablet (10 mg total) by mouth 3 (three) times daily. 01/31/22   Eliezer Lofts, FNP    Family History Family History  Problem Relation Age of Onset   Diabetes Mother    Heart disease Father    Hypertension Father    Cancer Maternal Grandfather    Heart disease Paternal Grandmother    Heart disease Paternal Grandfather    Social History Social History   Tobacco Use   Smoking status: Never   Smokeless tobacco: Never  Vaping Use   Vaping Use: Never used  Substance Use Topics   Alcohol use: Yes    Comment: 1-2 per month   Drug use: No   Allergies   Norco  [hydrocodone-acetaminophen] and Amoxicillin  Review of Systems Review of Systems Pertinent findings revealed after performing a 14 point review of systems has been noted in the history of present illness.  Physical Exam Vital Signs BP 119/82 (BP Location: Right Arm)   Pulse 69   Temp 98.5 F (36.9 C) (Oral)   Resp 14   Wt 128 lb (58.1 kg)   SpO2 96%   BMI 22.67 kg/m   No data found.  Physical Exam Vitals and nursing note reviewed.  Constitutional:      General: She is not in acute distress.    Appearance: Normal appearance. She is not ill-appearing.  HENT:     Head: Normocephalic and atraumatic.     Salivary Glands: Right salivary gland is not diffusely enlarged or tender. Left salivary gland is not diffusely enlarged or tender.     Right Ear: Hearing, ear canal and external ear normal. No drainage. No middle ear effusion. There is no impacted cerumen. Tympanic membrane is bulging (Clear fluid). Tympanic membrane is not erythematous.     Left Ear: Hearing, ear canal and external ear normal. No drainage. A middle ear effusion is present. There is no impacted cerumen. Tympanic membrane is injected, erythematous and bulging.     Nose: Nose normal. No nasal deformity, septal deviation,  mucosal edema, congestion or rhinorrhea.     Right Turbinates: Not enlarged, swollen or pale.     Left Turbinates: Not enlarged, swollen or pale.     Right Sinus: No maxillary sinus tenderness or frontal sinus tenderness.     Left Sinus: No maxillary sinus tenderness or frontal sinus tenderness.     Mouth/Throat:     Lips: Pink. No lesions.     Mouth: Mucous membranes are moist. No oral lesions.     Pharynx: Oropharynx is clear. Uvula midline. No posterior oropharyngeal erythema or uvula swelling.     Tonsils: No tonsillar exudate. 0 on the right. 0 on the left.  Eyes:     General: Lids are normal.        Right eye: No discharge.        Left eye: No discharge.     Extraocular Movements:  Extraocular movements intact.     Conjunctiva/sclera: Conjunctivae normal.     Right eye: Right conjunctiva is not injected.     Left eye: Left conjunctiva is not injected.  Neck:     Trachea: Trachea and phonation normal.  Cardiovascular:     Rate and Rhythm: Normal rate and regular rhythm.     Pulses: Normal pulses.     Heart sounds: Normal heart sounds. No murmur heard.    No friction rub. No gallop.  Pulmonary:     Effort: Pulmonary effort is normal. No accessory muscle usage, prolonged expiration or respiratory distress.     Breath sounds: Normal breath sounds. No stridor, decreased air movement or transmitted upper airway sounds. No decreased breath sounds, wheezing, rhonchi or rales.  Chest:     Chest wall: No tenderness.  Musculoskeletal:        General: Normal range of motion.     Cervical back: Normal range of motion and neck supple. Normal range of motion.  Lymphadenopathy:     Cervical: No cervical adenopathy.  Skin:    General: Skin is warm and dry.     Findings: No erythema or rash.  Neurological:     General: No focal deficit present.     Mental Status: She is alert and oriented to person, place, and time.  Psychiatric:        Mood and Affect: Mood normal.        Behavior: Behavior normal.     Visual Acuity Right Eye Distance:   Left Eye Distance:   Bilateral Distance:    Right Eye Near:   Left Eye Near:    Bilateral Near:     UC Couse / Diagnostics / Procedures:     Radiology No results found.  Procedures Procedures (including critical care time) EKG  Pending results:  Labs Reviewed - No data to display  Medications Ordered in UC: Medications - No data to display  UC Diagnoses / Final Clinical Impressions(s)   I have reviewed the triage vital signs and the nursing notes.  Pertinent labs & imaging results that were available during my care of the patient were reviewed by me and considered in my medical decision making (see chart for  details).    Final diagnoses:  Non-recurrent acute suppurative otitis media of left ear without spontaneous rupture of tympanic membrane   Based on physical exam findings, patient advised to begin cefdinir for 10 days.  Patient also provided with an antihistamine tablet to begin taking now which will hopefully reduce the pressure of excess fluid in both of her ears making  her flight more comfortable.  Conservative care recommended.  Return precautions advised.  Please see discharge instructions below for further details of plan of care as provided to patient. ED Prescriptions     Medication Sig Dispense Auth. Provider   cefdinir (OMNICEF) 300 MG capsule Take 1 capsule (300 mg total) by mouth 2 (two) times daily for 10 days. 20 capsule Lynden Oxford Scales, PA-C   cetirizine (ZYRTEC ALLERGY) 10 MG tablet Take 1 tablet (10 mg total) by mouth at bedtime. 30 tablet Lynden Oxford Scales, PA-C      PDMP not reviewed this encounter.  Disposition Upon Discharge:  Condition: stable for discharge home Home: take medications as prescribed; routine discharge instructions as discussed; follow up as advised.  Patient presented with an acute illness with associated systemic symptoms and significant discomfort requiring urgent management. In my opinion, this is a condition that a prudent lay person (someone who possesses an average knowledge of health and medicine) may potentially expect to result in complications if not addressed urgently such as respiratory distress, impairment of bodily function or dysfunction of bodily organs.   Routine symptom specific, illness specific and/or disease specific instructions were discussed with the patient and/or caregiver at length.   As such, the patient has been evaluated and assessed, work-up was performed and treatment was provided in alignment with urgent care protocols and evidence based medicine.  Patient/parent/caregiver has been advised that the patient  may require follow up for further testing and treatment if the symptoms continue in spite of treatment, as clinically indicated and appropriate.  If the patient was tested for COVID-19, Influenza and/or RSV, then the patient/parent/guardian was advised to isolate at home pending the results of his/her diagnostic coronavirus test and potentially longer if they're positive. I have also advised pt that if his/her COVID-19 test returns positive, it's recommended to self-isolate for at least 10 days after symptoms first appeared AND until fever-free for 24 hours without fever reducer AND other symptoms have improved or resolved. Discussed self-isolation recommendations as well as instructions for household member/close contacts as per the [facility] and [place] [facility], and also gave patient the [place] packet with this information.  Patient/parent/caregiver has been advised to return to the [facility] or PCP in 3-5 days if no better; to PCP or the Emergency Department if new signs and symptoms develop, or if the current signs or symptoms continue to change or worsen for further workup, evaluation and treatment as clinically indicated and appropriate  The patient will follow up with their current PCP if and as advised. If the patient does not currently have a PCP we will assist them in obtaining one.   The patient may need specialty follow up if the symptoms continue, in spite of conservative treatment and management, for further workup, evaluation, consultation and treatment as clinically indicated and appropriate.  Patient/parent/caregiver verbalized understanding and agreement of plan as discussed.  All questions were addressed during visit.  Please see discharge instructions below for further details of plan.  Discharge Instructions:   Discharge Instructions      Please read below to learn more about the medications, dosages and frequencies that I recommend to help alleviate your symptoms and to get you feeling better  soon:   Omnicef (cefdinir):  Please take one (1) dose twice daily for 10 days.  This antibiotic can cause upset stomach, this will resolve once antibiotics are complete.  You are welcome to take a probiotic, eat yogurt, take Imodium while taking this medication.  Please avoid other systemic medications such as Maalox, Pepto-Bismol or milk of magnesia as they can interfere with the body's ability to absorb the antibiotics.   Zyrtec (cetirizine): This is an excellent second-generation antihistamine that helps to reduce respiratory inflammatory response to environmental allergens.  In some patients, this medication can cause daytime sleepiness so I recommend that you take 1 tablet daily at bedtime.      Advil, Motrin (ibuprofen): This is a good anti-inflammatory medication which addresses aches, pains and inflammation of the upper airways that causes sinus and nasal congestion as well as in the lower airways which makes your cough feel tight and sometimes burn.  I recommend that you take between 400 to 600 mg every 6-8 hours as needed.      Please follow-up within the next 7-10 days either with your primary care provider or urgent care if your symptoms do not resolve.  If you do not have a primary care provider, we will assist you in finding one.        Thank you for visiting urgent care today.  We appreciate the opportunity to participate in your care.       This office note has been dictated using Museum/gallery curator.  Unfortunately, this method of dictation can sometimes lead to typographical or grammatical errors.  I apologize for your inconvenience in advance if this occurs.  Please do not hesitate to reach out to me if clarification is needed.      Lynden Oxford Scales, [place] 04/04/22 [phone]

## 2022-04-03 ENCOUNTER — Telehealth: Payer: Self-pay

## 2022-04-03 NOTE — Telephone Encounter (Signed)
TCT pt to f/u from recent visit. HIPAA compliant VM left for return call.

## 2022-11-02 ENCOUNTER — Ambulatory Visit
Admission: EM | Admit: 2022-11-02 | Discharge: 2022-11-02 | Disposition: A | Discharge status: Home / Self Care | Payer: No Typology Code available for payment source | Attending: Family Medicine | Admitting: Family Medicine

## 2022-11-02 ENCOUNTER — Other Ambulatory Visit: Payer: Self-pay

## 2022-11-02 DIAGNOSIS — H6993 Unspecified Eustachian tube disorder, bilateral: Secondary | ICD-10-CM

## 2022-11-02 MED ORDER — PREDNISONE 50 MG PO TABS: ORAL_TABLET | ORAL | 0 refills | Status: DC

## 2022-11-02 NOTE — ED Triage Notes (Signed)
Bil ear fullness x 2 days, reports equilibrium is off also

## 2022-11-02 NOTE — Discharge Instructions (Addendum)
Drink lots of water Take the prednisone once daily for 5 days See your doctor if not improving in a few days

## 2022-11-02 NOTE — ED Provider Notes (Signed)
[facility]    CSN: [id number] Arrival date & time: 11/02/22  0802      History   Chief Complaint No chief complaint on file.   HPI Judith Baker is a 57 y.o. female.   Very pleasant 57 year old woman who is here with bilateral ear pressure and discomfort.  States "I feel like my head is underwater".  Also feels her equilibrium is off.  Concerned about an ear infection, she had a severe ear infection earlier this year with TM rupture.  Denies any sore throat or sinus congestion.  No known allergies.    Past Medical History:  Diagnosis Date   Chest discomfort 06/05/2014   Associated jaw discomfort/ radiation    Sleeping difficulty     Patient Active Problem List   Diagnosis Date Noted   Paresthesia 12/09/2018   Chronic insomnia 12/09/2018   Chest discomfort 06/05/2014    Past Surgical History:  Procedure Laterality Date   ABLATION     TUBAL LIGATION      OB History   No obstetric history on file.      Home Medications    Prior to Admission medications   Medication Sig Start Date End Date Taking? Authorizing Provider  predniSONE (DELTASONE) 50 MG tablet Take once a day for 5 days.  Take with food 11/02/22  Yes Eustace Moore, MD    Family History Family History  Problem Relation Age of Onset   Diabetes Mother    Heart disease Father    Hypertension Father    Cancer Maternal Grandfather    Heart disease Paternal Grandmother    Heart disease Paternal Grandfather     Social History Social History   Tobacco Use   Smoking status: Never   Smokeless tobacco: Never  Vaping Use   Vaping status: Never Used  Substance Use Topics   Alcohol use: Yes    Comment: 1-2 per month   Drug use: No     Allergies   Norco [hydrocodone-acetaminophen] and Amoxicillin   Review of Systems Review of Systems  See HPI Physical Exam Triage Vital Signs ED Triage Vitals  Encounter Vitals Group     BP 11/02/22 0811 136/87     Systolic BP  Percentile --      Diastolic BP Percentile --      Pulse Rate 11/02/22 0811 66     Resp 11/02/22 0811 16     Temp 11/02/22 0811 98.2 F (36.8 C)     Temp src --      SpO2 11/02/22 0811 99 %     Weight --      Height --      Head Circumference --      Peak Flow --      Pain Score 11/02/22 0814 0     Pain Loc --      Pain Education --      Exclude from Growth Chart --    No data found.  Updated Vital Signs BP 136/87 (BP Location: Left Arm)   Pulse 66   Temp 98.2 F (36.8 C)   Resp 16   SpO2 99%       Physical Exam Constitutional:      General: She is not in acute distress.    Appearance: She is well-developed.  HENT:     Head: Normocephalic and atraumatic.     Ears:     Comments: Both TMs are retracted.  No erythema    Nose:  Nose normal.     Mouth/Throat:     Mouth: Mucous membranes are moist.     Pharynx: No posterior oropharyngeal erythema.  Eyes:     Conjunctiva/sclera: Conjunctivae normal.     Pupils: Pupils are equal, round, and reactive to light.  Cardiovascular:     Rate and Rhythm: Normal rate.  Pulmonary:     Effort: Pulmonary effort is normal. No respiratory distress.  Abdominal:     General: There is no distension.     Palpations: Abdomen is soft.  Musculoskeletal:        General: Normal range of motion.     Cervical back: Normal range of motion.  Lymphadenopathy:     Cervical: No cervical adenopathy.  Skin:    General: Skin is warm and dry.  Neurological:     Mental Status: She is alert.      UC Treatments / Results  Labs (all labs ordered are listed, but only abnormal results are displayed) Labs Reviewed - No data to display  EKG   Radiology No results found.  Procedures Procedures (including critical care time)  Medications Ordered in UC Medications - No data to display  Initial Impression / Assessment and Plan / UC Course  I have reviewed the triage vital signs and the nursing notes.  Pertinent labs & imaging results  that were available during my care of the patient were reviewed by me and considered in my medical decision making (see chart for details).    Final Clinical Impressions(s) / UC Diagnoses   Final diagnoses:  Eustachian tube dysfunction, bilateral     Discharge Instructions      Drink lots of water Take the prednisone once daily for 5 days See your doctor if not improving in a few days   ED Prescriptions     Medication Sig Dispense Auth. Provider   predniSONE (DELTASONE) 50 MG tablet Take once a day for 5 days.  Take with food 5 tablet Eustace Moore, MD      PDMP not reviewed this encounter.   Eustace Moore, MD 11/02/22 [phone]

## 2023-10-02 ENCOUNTER — Other Ambulatory Visit: Payer: Self-pay

## 2023-10-02 ENCOUNTER — Ambulatory Visit
Admission: EM | Admit: 2023-10-02 | Discharge: 2023-10-02 | Disposition: A | Discharge status: Home / Self Care | Attending: Family Medicine | Admitting: Family Medicine

## 2023-10-02 DIAGNOSIS — H6691 Otitis media, unspecified, right ear: Secondary | ICD-10-CM | POA: Diagnosis not present

## 2023-10-02 MED ORDER — CEFDINIR 300 MG PO CAPS: 300.0000 mg | ORAL_CAPSULE | Freq: Two times a day (BID) | ORAL | 0 refills | Status: DC

## 2023-10-02 NOTE — ED Provider Notes (Signed)
 [name]    CSN: [id number] Arrival date & time: 10/02/23  1151      History   Chief Complaint Chief Complaint  Patient presents with   Headache   Ear Fullness    HPI Doralene Glanz is a 58 y.o. female.   Five days ago patient developed pressure sensation and pain in her face leading to a migraine like headache. She has had post-nasal drainage and her hearing has been muffled.  She denies fever and cough.  The history is provided by the patient.    Past Medical History:  Diagnosis Date   Chest discomfort 06/05/2014   Associated jaw discomfort/ radiation    Sleeping difficulty     Patient Active Problem List   Diagnosis Date Noted   Paresthesia 12/09/2018   Chronic insomnia 12/09/2018   Chest discomfort 06/05/2014    Past Surgical History:  Procedure Laterality Date   ABLATION     TUBAL LIGATION      OB History   No obstetric history on file.      Home Medications    Prior to Admission medications   Medication Sig Start Date End Date Taking? Authorizing Provider  cefdinir  (OMNICEF ) 300 MG capsule Take 1 capsule (300 mg total) by mouth 2 (two) times daily. 10/02/23  Yes Pauline Garnette LABOR, MD    Family History Family History  Problem Relation Age of Onset   Diabetes Mother    Heart disease Father    Hypertension Father    Cancer Maternal Grandfather    Heart disease Paternal Grandmother    Heart disease Paternal Grandfather     Social History Social History   Tobacco Use   Smoking status: Never   Smokeless tobacco: Never  Vaping Use   Vaping status: Never Used  Substance Use Topics   Alcohol use: Yes    Comment: 1-2 per month   Drug use: No     Allergies   Norco [hydrocodone-acetaminophen] and Amoxicillin   Review of Systems Review of Systems No sore throat No cough No pleuritic pain No wheezing + nasal congestion + post-nasal drainage + sinus pain/pressure No itchy/red eyes ? earache No hemoptysis No  SOB No fever No nausea No vomiting No abdominal pain No diarrhea No urinary symptoms No skin rash No fatigue No myalgias + headache Used OTC meds (Excedrine migraine) without relief   Physical Exam Triage Vital Signs ED Triage Vitals  Encounter Vitals Group     BP 10/02/23 1204 (!) 142/82     Girls Systolic BP Percentile --      Girls Diastolic BP Percentile --      Boys Systolic BP Percentile --      Boys Diastolic BP Percentile --      Pulse Rate 10/02/23 1204 69     Resp 10/02/23 1204 16     Temp 10/02/23 1204 98.3 F (36.8 C)     Temp src --      SpO2 10/02/23 1204 98 %     Weight --      Height --      Head Circumference --      Peak Flow --      Pain Score 10/02/23 1206 6     Pain Loc --      Pain Education --      Exclude from Growth Chart --    No data found.  Updated Vital Signs BP (!) 142/82   Pulse 69   Temp  98.3 F (36.8 C)   Resp 16   SpO2 98%   Visual Acuity Right Eye Distance:   Left Eye Distance:   Bilateral Distance:    Right Eye Near:   Left Eye Near:    Bilateral Near:     Physical Exam Nursing notes and Vital Signs reviewed. Appearance:  Patient appears stated age, and in no acute distress Eyes:  Pupils are equal, round, and reactive to light and accomodation.  Extraocular movement is intact.  Conjunctivae are not inflamed  Ears:  Canals normal.  Left tympanic membrane normal.  Right tympanic membrane retracted and erythematous centrally. Nose:  Mildly congested turbinates.  Maxillary sinus tenderness is present.  Pharynx:  Normal Neck:  Supple. No adenopathy.  Lungs:  Clear to auscultation.  Breath sounds are equal.  Moving air well. Heart:  Regular rate and rhythm without murmurs, rubs, or gallops.  Abdomen:  Nontender without masses or hepatosplenomegaly.  Bowel sounds are present.  No CVA or flank tenderness.  Extremities:  No edema.  Skin:  No rash present.   UC Treatments / Results  Labs (all labs ordered are listed,  but only abnormal results are displayed) Labs Reviewed - No data to display  EKG   Radiology No results found.  Procedures Procedures (including critical care time)  Medications Ordered in UC Medications - No data to display  Initial Impression / Assessment and Plan / UC Course  I have reviewed the triage vital signs and the nursing notes.  Pertinent labs & imaging results that were available during my care of the patient were reviewed by me and considered in my medical decision making (see chart for details).    Begin Omnicef . Followup with Family Doctor if not improved in one week.   Final Clinical Impressions(s) / UC Diagnoses   Final diagnoses:  Acute right otitis media     Discharge Instructions      May take Pseudoephedrine (30mg , one or two every 4 to 6 hours) for sinus congestion.  Get adequate rest.   May use Afrin nasal spray (or generic oxymetazoline) each morning for about 5 days and then discontinue.  Also recommend using saline nasal spray several times daily and saline nasal irrigation (AYR is a common brand).  Use Flonase  nasal spray each morning after using Afrin nasal spray and saline nasal irrigation. If cough develops, may take plain guaifenesin  (1200mg  extended release tabs such as Mucinex ) twice daily, with plenty of water.    ED Prescriptions     Medication Sig Dispense Auth. Provider   cefdinir  (OMNICEF ) 300 MG capsule Take 1 capsule (300 mg total) by mouth 2 (two) times daily. 14 capsule Pauline Garnette LABOR, MD         Pauline Garnette LABOR, MD 10/04/23 [phone]

## 2023-10-02 NOTE — Discharge Instructions (Signed)
May take Pseudoephedrine (30mg , one or two every 4 to 6 hours) for sinus congestion.  Get adequate rest.   May use Afrin nasal spray (or generic oxymetazoline) each morning for about 5 days and then discontinue.  Also recommend using saline nasal spray several times daily and saline nasal irrigation (AYR is a common brand).  Use Flonase nasal spray each morning after using Afrin nasal spray and saline nasal irrigation.    If cough develops, may take plain guaifenesin (1200mg  extended release tabs such as Mucinex) twice daily, with plenty of water.

## 2023-10-02 NOTE — ED Triage Notes (Addendum)
 Since Sunday or Monday has had drainage in back of throat, ha, bil ear muffled. Has had excedrine migraine which only lasts for a short while. No fever. Headache is better when pushing on sinuses.

## 2023-10-03 ENCOUNTER — Telehealth: Payer: Self-pay

## 2023-10-03 NOTE — Telephone Encounter (Signed)
 Called to check on patient. Got meds. No needs from ucc right now.

## 2023-12-09 ENCOUNTER — Other Ambulatory Visit: Payer: Self-pay | Admitting: Obstetrics and Gynecology

## 2023-12-09 DIAGNOSIS — Z1231 Encounter for screening mammogram for malignant neoplasm of breast: Secondary | ICD-10-CM

## 2024-01-08 ENCOUNTER — Ambulatory Visit

## 2024-01-25 ENCOUNTER — Telehealth: Payer: Self-pay | Admitting: Family Medicine

## 2024-01-25 NOTE — Telephone Encounter (Signed)
 Please schedule new patient appt with Dr. Katrinka. Advise if nose keeps bleeding to report to urgent care.   Thanks!!!

## 2024-01-29 NOTE — Telephone Encounter (Signed)
 Pt called and is going to call back Monday to sche appt, override is needed. Judith Baker

## 2024-01-29 NOTE — Telephone Encounter (Signed)
 Called pt to get sche a pt appointment

## 2024-02-19 ENCOUNTER — Other Ambulatory Visit: Payer: Self-pay

## 2024-02-19 ENCOUNTER — Ambulatory Visit

## 2024-02-19 ENCOUNTER — Ambulatory Visit
Admission: EM | Admit: 2024-02-19 | Discharge: 2024-02-19 | Disposition: A | Discharge status: Home / Self Care | Source: Home / Self Care | Attending: Family Medicine | Admitting: Family Medicine

## 2024-02-19 ENCOUNTER — Other Ambulatory Visit (HOSPITAL_COMMUNITY): Payer: Self-pay

## 2024-02-19 DIAGNOSIS — R519 Headache, unspecified: Secondary | ICD-10-CM

## 2024-02-19 DIAGNOSIS — J0100 Acute maxillary sinusitis, unspecified: Secondary | ICD-10-CM

## 2024-02-19 HISTORY — DX: Anxiety disorder, unspecified: F41.9

## 2024-02-19 MED ORDER — DOXYCYCLINE HYCLATE 100 MG PO CAPS: ORAL_CAPSULE | ORAL | 0 refills | Status: DC

## 2024-02-19 NOTE — ED Triage Notes (Signed)
 Pt c/o [id number] Pt states 3 wks ago had epistaxis twice from right nostril and happened again yesterday. Pt states eccedrin migraine helps for a few hours then it comes back. Pt states had facial pain on Tuesday, but it has resolved. Pt c/o eye pain bilat

## 2024-02-19 NOTE — Discharge Instructions (Signed)
 Recommend using saline nasal spray several times daily or saline nasal irrigation (AYR is a common brand).

## 2024-02-20 NOTE — ED Provider Notes (Signed)
 " Judith Baker    CSN: [id number] Arrival date & time: 02/19/24  1011      History   Chief Complaint No chief complaint on file.   HPI Judith Baker is a 59 y.o. female.   Patient complains of intermittent dull frontal headache for about four weeks. Her headaches usually respond to Excedrin Migraine. She reports that she has had three episodes of epistaxis from her right nostril in the past three weeks (last epistaxis yesterday), and she has had increased post-nasal drainage for four weeks.  She had facial pain three days ago, now resolved.  She occasionally has pain behind her eyes.  She denies fevers, chills, and sweats and feels well otherwise.  The history is provided by the patient.    Past Medical History:  Diagnosis Date   Anxiety    Chest discomfort 06/05/2014   Associated jaw discomfort/ radiation    Sleeping difficulty     Patient Active Problem List   Diagnosis Date Noted   Paresthesia 12/09/2018   Chronic insomnia 12/09/2018   Chest discomfort 06/05/2014    Past Surgical History:  Procedure Laterality Date   ABLATION     NEPHRECTOMY TRANSPLANTED ORGAN     TUBAL LIGATION      OB History   No obstetric history on file.      Home Medications    Prior to Admission medications  Medication Sig Start Date End Date Taking? Authorizing Provider  ALPRAZolam (XANAX) 0.25 MG tablet Take 0.25 mg by mouth at bedtime as needed. 01/08/21  Yes [provider]  doxycycline  (VIBRAMYCIN ) 100 MG capsule Take one cap PO Q12hr with food. 02/19/24  Yes Pauline Garnette LABOR, MD    Family History Family History  Problem Relation Age of Onset   Diabetes Mother    Heart disease Father    Hypertension Father    Cancer Maternal Grandfather    Heart disease Paternal Grandmother    Heart disease Paternal Grandfather     Social History Social History[1]   Allergies   Norco [hydrocodone-acetaminophen] and Amoxicillin   Review of Systems Review  of Systems  Constitutional:  Negative for activity change, appetite change, chills, diaphoresis, fatigue and fever.  HENT:  Positive for congestion, nosebleeds, postnasal drip, sinus pressure and sinus pain. Negative for facial swelling, rhinorrhea and sore throat.   Eyes: Negative.   Respiratory: Negative.    Cardiovascular: Negative.   Gastrointestinal: Negative.   Genitourinary: Negative.   Musculoskeletal: Negative.   Neurological:  Positive for headaches.     Physical Exam Triage Vital Signs ED Triage Vitals  Encounter Vitals Group     BP 02/19/24 1142 (!) 150/94     Girls Systolic BP Percentile --      Girls Diastolic BP Percentile --      Boys Systolic BP Percentile --      Boys Diastolic BP Percentile --      Pulse Rate 02/19/24 1142 62     Resp 02/19/24 1142 17     Temp 02/19/24 1142 98.4 F (36.9 C)     Temp Source 02/19/24 1142 Oral     SpO2 02/19/24 1142 99 %     Weight --      Height --      Head Circumference --      Peak Flow --      Pain Score 02/19/24 1140 3     Pain Loc --      Pain Education --  Exclude from Growth Chart --    No data found.  Updated Vital Signs BP (!) 150/94   Pulse 62   Temp 98.4 F (36.9 C) (Oral)   Resp 17   SpO2 99%   Visual Acuity Right Eye Distance:   Left Eye Distance:   Bilateral Distance:    Right Eye Near:   Left Eye Near:    Bilateral Near:     Physical Exam Vitals reviewed.  Constitutional:      General: She is not in acute distress.    Appearance: She is not ill-appearing.  HENT:     Head: Normocephalic.     Right Ear: Tympanic membrane, ear canal and external ear normal.     Left Ear: Tympanic membrane, ear canal and external ear normal.     Nose: Nose normal. No congestion or rhinorrhea.     Comments: No evidence epistaxis today.    Mouth/Throat:     Mouth: Mucous membranes are moist.     Pharynx: Oropharynx is clear.  Eyes:     Extraocular Movements: Extraocular movements intact.      Conjunctiva/sclera: Conjunctivae normal.     Pupils: Pupils are equal, round, and reactive to light.  Cardiovascular:     Rate and Rhythm: Normal rate and regular rhythm.     Heart sounds: Normal heart sounds.  Pulmonary:     Breath sounds: Normal breath sounds.  Musculoskeletal:     Cervical back: Neck supple.  Lymphadenopathy:     Cervical: No cervical adenopathy.  Skin:    General: Skin is warm.  Neurological:     General: No focal deficit present.     Mental Status: She is alert.      UC Treatments / Results  Labs (all labs ordered are listed, but only abnormal results are displayed) Labs Reviewed - No data to display  EKG   Radiology DG Sinuses Complete Result Date: 02/19/2024 CLINICAL DATA:  Frontal facial pain for 4 weeks EXAM: PARANASAL SINUSES - COMPLETE 3 + VIEW COMPARISON:  September 02, 2016 FINDINGS: The paranasal sinus are aerated. There is no evidence of sinus opacification air-fluid levels or mucosal thickening. No significant bone abnormalities are seen. IMPRESSION: Negative. Electronically Signed   By: Lynwood Landy Raddle M.D.   On: 02/19/2024 13:14    Procedures Procedures (including critical Baker time)  Medications Ordered in UC Medications - No data to display  Initial Impression / Assessment and Plan / UC Course  I have reviewed the triage vital signs and the nursing notes.  Pertinent labs & imaging results that were available during my Baker of the patient were reviewed by me and considered in my medical decision making (see chart for details).    Will empirically treat for sinusitis with doxycycline  !00mg  Q12hr. Followup with ENT if not improved two weeks.  Final Clinical Impressions(s) / UC Diagnoses   Final diagnoses:  Facial pain  Acute maxillary sinusitis, recurrence not specified     Discharge Instructions      Recommend using saline nasal spray several times daily or saline nasal irrigation (AYR is a common brand).     ED  Prescriptions     Medication Sig Dispense Auth. Provider   doxycycline  (VIBRAMYCIN ) 100 MG capsule Take one cap PO Q12hr with food. 14 capsule Pauline Garnette LABOR, MD          [1]  Social History Tobacco Use   Smoking status: Never   Smokeless tobacco: Never  Vaping Use  Vaping status: Never Used  Substance Use Topics   Alcohol use: Yes    Comment: 1-2 per month   Drug use: No     Pauline Garnette LABOR, MD 02/20/24 2112  "

## 2024-02-26 ENCOUNTER — Telehealth: Payer: Self-pay

## 2024-02-26 ENCOUNTER — Ambulatory Visit
Admission: RE | Admit: 2024-02-26 | Discharge: 2024-02-26 | Disposition: A | Discharge status: Home / Self Care | Attending: Family Medicine | Admitting: Family Medicine

## 2024-02-26 VITALS — BP 141/88 | HR 63 | Temp 99.1°F | Resp 18

## 2024-02-26 DIAGNOSIS — J0100 Acute maxillary sinusitis, unspecified: Secondary | ICD-10-CM

## 2024-02-26 DIAGNOSIS — R0981 Nasal congestion: Secondary | ICD-10-CM

## 2024-02-26 MED ORDER — SULFAMETHOXAZOLE-TRIMETHOPRIM 800-160 MG PO TABS: 1.0000 | ORAL_TABLET | Freq: Two times a day (BID) | ORAL | 0 refills | Status: AC

## 2024-02-26 MED ORDER — PREDNISONE 20 MG PO TABS: ORAL_TABLET | ORAL | 0 refills | Status: AC

## 2024-02-26 NOTE — Telephone Encounter (Signed)
 Patient called back due to the patient calling the [facility] previously. Patient's voicemail was not set up to leave a voicemail.

## 2024-02-26 NOTE — ED Provider Notes (Signed)
 " Judith Baker    CSN: [id number] Arrival date & time: 02/26/24  1600      History   Chief Complaint Chief Complaint  Patient presents with   Headache    Recheck from last Friday..sinus infection..possible need change in antibiotics - Entered by patient   Sinus Pressure    HPI Judith Baker is a 59 y.o. female.   HPI 60 year old female presents for follow-up recheck of last Friday sinus infection and possible change of antibiotics.  Patient was evaluated here for acute maxillary sinusitis and facial pain please see epic for that encounter note.  PMH significant for anxiety, chest discomfort and phonic insomnia.  Past Medical History:  Diagnosis Date   Anxiety    Chest discomfort 06/05/2014   Associated jaw discomfort/ radiation    Sleeping difficulty     Patient Active Problem List   Diagnosis Date Noted   Anxiety 08/02/2020   Paresthesia 12/09/2018   Chronic insomnia 12/09/2018   Chest discomfort 06/05/2014    Past Surgical History:  Procedure Laterality Date   ABLATION     NEPHRECTOMY TRANSPLANTED ORGAN     TUBAL LIGATION      OB History     Gravida  2   Para      Term      Preterm      AB      Living         SAB      IAB      Ectopic      Multiple      Live Births  2            Home Medications    Prior to Admission medications  Medication Sig Start Date End Date Taking? Authorizing Provider  ALPRAZolam (XANAX) 0.25 MG tablet Take 0.25 mg by mouth at bedtime as needed. 01/08/21  Yes [provider]  predniSONE  (DELTASONE ) 20 MG tablet Take 3 tabs PO daily x 5 days. 02/26/24  Yes Judith Sharper, FNP  sulfamethoxazole -trimethoprim  (BACTRIM  DS) 800-160 MG tablet Take 1 tablet by mouth 2 (two) times daily for 7 days. 02/26/24 03/04/24 Yes Judith Sharper, FNP    Family History Family History  Problem Relation Age of Onset   Diabetes Mother    Heart disease Father    Hypertension Father    Cancer Maternal  Grandfather    Heart disease Paternal Grandmother    Heart disease Paternal Grandfather     Social History Social History[1]   Allergies   Norco [hydrocodone-acetaminophen] and Amoxicillin   Review of Systems Review of Systems   Physical Exam Triage Vital Signs ED Triage Vitals  Encounter Vitals Group     BP      Girls Systolic BP Percentile      Girls Diastolic BP Percentile      Boys Systolic BP Percentile      Boys Diastolic BP Percentile      Pulse      Resp      Temp      Temp src      SpO2      Weight      Height      Head Circumference      Peak Flow      Pain Score      Pain Loc      Pain Education      Exclude from Growth Chart    No data found.  Updated Vital Signs BP ROLLEN)  141/88 (BP Location: Right Arm)   Pulse 63   Temp 99.1 F (37.3 C) (Oral)   Resp 18   SpO2 97%      Physical Exam Vitals and nursing note reviewed.  Constitutional:      Appearance: Normal appearance. She is normal weight. She is ill-appearing.  HENT:     Head: Normocephalic and atraumatic.     Right Ear: Tympanic membrane and external ear normal.     Left Ear: Tympanic membrane and external ear normal.     Ears:     Comments: Significant eustachian tube dysfunction noted bilaterally    Nose:     Right Sinus: Maxillary sinus tenderness present.     Left Sinus: Maxillary sinus tenderness present.     Mouth/Throat:     Mouth: Mucous membranes are moist.     Pharynx: Oropharynx is clear.  Eyes:     Extraocular Movements: Extraocular movements intact.     Conjunctiva/sclera: Conjunctivae normal.     Pupils: Pupils are equal, round, and reactive to light.  Cardiovascular:     Rate and Rhythm: Normal rate and regular rhythm.     Heart sounds: Normal heart sounds.  Pulmonary:     Effort: Pulmonary effort is normal.     Breath sounds: Normal breath sounds. No wheezing, rhonchi or rales.  Musculoskeletal:        General: Normal range of motion.  Skin:    General: Skin  is warm and dry.  Neurological:     General: No focal deficit present.     Mental Status: She is alert and oriented to person, place, and time. Mental status is at baseline.  Psychiatric:        Mood and Affect: Mood normal.        Behavior: Behavior normal.      UC Treatments / Results  Labs (all labs ordered are listed, but only abnormal results are displayed) Labs Reviewed - No data to display  EKG   Radiology No results found.  Procedures Procedures (including critical Baker time)  Medications Ordered in UC Medications - No data to display  Initial Impression / Assessment and Plan / UC Course  I have reviewed the triage vital signs and the nursing notes.  Pertinent labs & imaging results that were available during my Baker of the patient were reviewed by me and considered in my medical decision making (see chart for details).     MDM: 1.  Acute maxillary sinusitis, recurrence not specified-Rx'd Bactrim  DS 800/160 mg tablet: Take 1 tablet twice daily x 7 days; 2.  Congestion of nasal sinus-Rx'd prednisone  20 mg tablet: Take 3 tablets p.o. daily x 5 days. Advised patient take medications as directed with food to completion.  Advised take prednisone  with first dose of Augmentin for the next 5 of 7 days.  Encouraged increase daily water intake to 64 ounces per day while taking these medications.  Advised if symptoms worsen and/or unresolved please follow-up with your PCP or here for further evaluation.  Patient discharged home, hemodynamically stable. Final Clinical Impressions(s) / UC Diagnoses   Final diagnoses:  Acute maxillary sinusitis, recurrence not specified  Congestion of nasal sinus     Discharge Instructions      Advised patient take medications as directed with food to completion.  Advised take prednisone  with first dose of Augmentin for the next 5 of 7 days.  Encouraged increase daily water intake to 64 ounces per day while taking these medications.  Advised  if symptoms worsen and/or unresolved please follow-up with your PCP or here for further evaluation.     ED Prescriptions     Medication Sig Dispense Auth. Provider   sulfamethoxazole -trimethoprim  (BACTRIM  DS) 800-160 MG tablet Take 1 tablet by mouth 2 (two) times daily for 7 days. 14 tablet Labrenda Lasky, FNP   predniSONE  (DELTASONE ) 20 MG tablet Take 3 tabs PO daily x 5 days. 15 tablet Raziya Aveni, FNP      PDMP not reviewed this encounter.    [1]  Social History Tobacco Use   Smoking status: Never    Passive exposure: Past   Smokeless tobacco: Never  Vaping Use   Vaping status: Never Used  Substance Use Topics   Alcohol use: Yes    Comment: 4+ per month   Drug use: No     Judith Sharper, FNP 02/26/24 1716  "

## 2024-02-26 NOTE — Discharge Instructions (Addendum)
 Advised patient take medications as directed with food to completion.  Advised take prednisone with first dose of Augmentin for the next 5 of 7 days.  Encouraged increase daily water intake to 64 ounces per day while taking these medications.  Advised if symptoms worsen and/or unresolved please follow-up with your PCP or here for further evaluation.

## 2024-02-26 NOTE — ED Triage Notes (Signed)
 For four weeks she has had a intermittent dull headache, sinus pressure, dull pressure behind her eyes, and fatigue. She states she started the medication on Saturday and at first the medication seemed to be helping. Excedrin has previously helped with her headaches.

## 2024-03-24 ENCOUNTER — Ambulatory Visit: Admitting: Family Medicine
